# Patient Record
Sex: Male | Born: 1950 | Hispanic: No | Marital: Married | Smoking: Never smoker
Health system: Southern US, Community
[De-identification: ages and names within clinical notes are randomized; demographics above are authoritative.]

## PROBLEM LIST (undated history)

## (undated) DIAGNOSIS — I10 Essential (primary) hypertension: Secondary | ICD-10-CM

## (undated) DIAGNOSIS — R7303 Prediabetes: Secondary | ICD-10-CM

## (undated) DIAGNOSIS — T8859XA Other complications of anesthesia, initial encounter: Secondary | ICD-10-CM

## (undated) DIAGNOSIS — E039 Hypothyroidism, unspecified: Secondary | ICD-10-CM

## (undated) HISTORY — PX: SHOULDER SURGERY: SHX246

---

## 2001-05-23 ENCOUNTER — Emergency Department (HOSPITAL_COMMUNITY): Admission: EM | Admit: 2001-05-23 | Discharge: 2001-05-23 | Payer: Self-pay | Admitting: Emergency Medicine

## 2001-05-25 ENCOUNTER — Emergency Department (HOSPITAL_COMMUNITY): Admission: EM | Admit: 2001-05-25 | Discharge: 2001-05-25 | Payer: Self-pay | Admitting: Emergency Medicine

## 2005-06-06 ENCOUNTER — Ambulatory Visit: Payer: Self-pay | Admitting: Gastroenterology

## 2006-04-11 ENCOUNTER — Ambulatory Visit: Payer: Self-pay

## 2006-04-29 ENCOUNTER — Ambulatory Visit: Payer: Self-pay | Admitting: Pain Medicine

## 2006-05-07 ENCOUNTER — Ambulatory Visit: Payer: Self-pay | Admitting: Pain Medicine

## 2007-09-17 ENCOUNTER — Encounter: Admission: RE | Admit: 2007-09-17 | Discharge: 2007-09-17 | Payer: Self-pay | Admitting: Neurosurgery

## 2011-02-24 ENCOUNTER — Ambulatory Visit: Payer: Self-pay | Admitting: Unknown Physician Specialty

## 2015-01-09 DIAGNOSIS — I10 Essential (primary) hypertension: Secondary | ICD-10-CM | POA: Insufficient documentation

## 2015-08-09 ENCOUNTER — Encounter: Payer: Self-pay | Admitting: *Deleted

## 2015-08-10 ENCOUNTER — Encounter: Payer: Self-pay | Admitting: *Deleted

## 2015-08-10 ENCOUNTER — Ambulatory Visit: Payer: BLUE CROSS/BLUE SHIELD | Admitting: Anesthesiology

## 2015-08-10 ENCOUNTER — Encounter
Admission: RE | Disposition: A | Discharge status: Home / Self Care | Payer: Self-pay | Source: Ambulatory Visit | Attending: Gastroenterology

## 2015-08-10 ENCOUNTER — Ambulatory Visit
Admission: RE | Admit: 2015-08-10 | Discharge: 2015-08-10 | Disposition: A | Discharge status: Home / Self Care | Payer: BLUE CROSS/BLUE SHIELD | Source: Ambulatory Visit | Attending: Gastroenterology | Admitting: Gastroenterology

## 2015-08-10 DIAGNOSIS — Z1211 Encounter for screening for malignant neoplasm of colon: Secondary | ICD-10-CM | POA: Insufficient documentation

## 2015-08-10 DIAGNOSIS — Z79899 Other long term (current) drug therapy: Secondary | ICD-10-CM | POA: Insufficient documentation

## 2015-08-10 DIAGNOSIS — I10 Essential (primary) hypertension: Secondary | ICD-10-CM | POA: Diagnosis not present

## 2015-08-10 DIAGNOSIS — K640 First degree hemorrhoids: Secondary | ICD-10-CM | POA: Diagnosis not present

## 2015-08-10 HISTORY — PX: COLONOSCOPY WITH PROPOFOL: SHX5780

## 2015-08-10 HISTORY — DX: Essential (primary) hypertension: I10

## 2015-08-10 SURGERY — COLONOSCOPY WITH PROPOFOL
Anesthesia: General

## 2015-08-10 MED ORDER — FENTANYL CITRATE (PF) 100 MCG/2ML IJ SOLN
INTRAMUSCULAR | Status: DC | PRN
  Administered 2015-08-10: 50 ug via INTRAVENOUS

## 2015-08-10 MED ORDER — MIDAZOLAM HCL 2 MG/2ML IJ SOLN
INTRAMUSCULAR | Status: DC | PRN
  Administered 2015-08-10: 1 mg via INTRAVENOUS

## 2015-08-10 MED ORDER — SODIUM CHLORIDE 0.9 % IV SOLN
INTRAVENOUS | Status: DC
  Administered 2015-08-10: 14:00:00 via INTRAVENOUS
  Administered 2015-08-10: 1000 mL via INTRAVENOUS

## 2015-08-10 MED ORDER — PROPOFOL 500 MG/50ML IV EMUL
INTRAVENOUS | Status: DC | PRN
  Administered 2015-08-10: 125 ug/kg/min via INTRAVENOUS

## 2015-08-10 MED ORDER — SODIUM CHLORIDE 0.9 % IV SOLN: INTRAVENOUS | Status: DC

## 2015-08-10 MED ORDER — PROPOFOL 10 MG/ML IV BOLUS
INTRAVENOUS | Status: DC | PRN
  Administered 2015-08-10: 50 mg via INTRAVENOUS

## 2015-08-10 NOTE — Op Note (Signed)
[facility] Gastroenterology Patient Name: Clarence Wolf Procedure Date: 08/10/2015 1:27 PM MRN: [id number] Account #: [id number] Date of Birth: Nov 16, 1950 Admit Type: Outpatient Age: [age] Room: [facility] 3 Gender: Male Note Status: Finalized Procedure:            Colonoscopy Indications:          Screening for colorectal malignant neoplasm Providers:            Christena DeemMartin U. Skulskie, MD Referring MD:         Barbette ReichmannVishwanath Hande, MD (Referring MD) Medicines:            Monitored Anesthesia Care Complications:        No immediate complications. Procedure:            Pre-Anesthesia Assessment:                       - ASA Grade Assessment: II - A patient with mild                        systemic disease.                       After obtaining informed consent, the colonoscope was                        passed under direct vision. Throughout the procedure,                        the patient's blood pressure, pulse, and oxygen                        saturations were monitored continuously. The                        Colonoscope was introduced through the anus and                        advanced to the the cecum, identified by appendiceal                        orifice and ileocecal valve. The colonoscopy was                        performed without difficulty. The patient tolerated the                        procedure well. The quality of the bowel preparation                        was good. Findings:      The colon (entire examined portion) appeared normal.      The digital rectal exam was normal.      Non-bleeding internal hemorrhoids were found during anoscopy. The       hemorrhoids were small and Grade I (internal hemorrhoids that do not       prolapse). Impression:           - The entire examined colon is normal.                       - Non-bleeding internal hemorrhoids.                       -  No specimens collected. Recommendation:       - Repeat colonoscopy in  10 years for screening purposes. Procedure Code(s):    --- Professional ---                       [phone], Colonoscopy, flexible; diagnostic, including                        collection of specimen(s) by brushing or washing, when                        performed (separate procedure) Diagnosis Code(s):    --- Professional ---                       Z12.11, Encounter for screening for malignant neoplasm                        of colon                       K64.0, First degree hemorrhoids CPT copyright 2016 American Medical Association. All rights reserved. The codes documented in this report are preliminary and upon coder review may  be revised to meet current compliance requirements. Christena Deem, MD 08/10/2015 2:00:27 PM This report has been signed electronically. Number of Addenda: 0 Note Initiated On: 08/10/2015 1:27 PM Scope Withdrawal Time: 0 hours 6 minutes 27 seconds  Total Procedure Duration: 0 hours 15 minutes 12 seconds       [facility]

## 2015-08-10 NOTE — H&P (Signed)
Outpatient short stay form Pre-procedure 08/10/2015 1:32 PM Clarence DeemMartin U Skulskie MD  Primary Physician: Dr Barbette ReichmannVishwanath Hande  Reason for visit:  Screening colonoscopy  History of present illness:  Patient is a 65 year old male presenting today for colonoscopy. Discussion with patient obtained through an interpreter. Patient has had a colonoscopy in the past she is uncertain of the findings. He tolerated his prep well. He denies use of any aspirin products with the exception of 81 mg aspirin which she has held for the past 2 days. He takes no blood thinning agents.    Current facility-administered medications:  .  0.9 %  sodium chloride infusion, , Intravenous, Continuous, Clarence DeemMartin U Skulskie, MD, Last Rate: 20 mL/hr at 08/10/15 1256, 1,000 mL at 08/10/15 1256 .  0.9 %  sodium chloride infusion, , Intravenous, Continuous, Clarence DeemMartin U Skulskie, MD  Prescriptions prior to admission  Medication Sig Dispense Refill Last Dose  . fluticasone (FLONASE) 50 MCG/ACT nasal spray Place 2 sprays into both nostrils daily.   Past Week at Unknown time  . losartan-hydrochlorothiazide (HYZAAR) 50-12.5 MG tablet Take 1 tablet by mouth daily.   Past Week at Unknown time  . Multiple Vitamin (MULTIVITAMIN) tablet Take 1 tablet by mouth daily.   Past Week at Unknown time     No Known Allergies   Past Medical History  Diagnosis Date  . Hypertension     Review of systems:      Physical Exam    Heart and lungs: Regular rate and rhythm without rub or gallop, lungs are bilaterally clear.    HEENT: Normocephalic atraumatic eyes are anicteric    Other:     Pertinant exam for procedure: Soft nontender nondistended bowel sounds positive normoactive. There is a generalized discomfort that the patient relates as being from the prep. Is no rebound.    Planned proceedures: Colonoscopy and indicated procedures. I have discussed the risks benefits and complications of procedures to include not limited to bleeding,  infection, perforation and the risk of sedation and the patient wishes to proceed.    Clarence DeemMartin U Skulskie, MD Gastroenterology 08/10/2015  1:32 PM

## 2015-08-10 NOTE — Anesthesia Procedure Notes (Signed)
Date/Time: 08/10/2015 1:35 PM Performed by: Junious SilkNOLES, Seamus Warehime Pre-anesthesia Checklist: Patient identified, Emergency Drugs available, Suction available, Patient being monitored and Timeout performed Oxygen Delivery Method: Nasal cannula

## 2015-08-10 NOTE — Transfer of Care (Signed)
Immediate Anesthesia Transfer of Care Note  Patient: Clarence MillerManuel D. Wolf  Procedure(s) Performed: Procedure(s): COLONOSCOPY WITH PROPOFOL (N/A)  Patient Location: PACU  Anesthesia Type:General  Level of Consciousness: sedated  Airway & Oxygen Therapy: Patient Spontanous Breathing and Patient connected to nasal cannula oxygen  Post-op Assessment: Report given to RN and Post -op Vital signs reviewed and stable  Post vital signs: Reviewed and stable  Last Vitals:  Filed Vitals:   08/10/15 1241  BP: 144/74  Pulse: 50  Temp: 36 C  Resp: 18    Last Pain: There were no vitals filed for this visit.       Complications: No apparent anesthesia complications

## 2015-08-10 NOTE — Anesthesia Preprocedure Evaluation (Addendum)
Anesthesia Evaluation  Patient identified by MRN, date of birth, ID band Patient awake    Reviewed: Allergy & Precautions, NPO status , Patient's Chart, lab work & pertinent test results, reviewed documented beta blocker date and time   Airway Mallampati: II  TM Distance: >3 FB     Dental  (+) Chipped, Partial Lower, Poor Dentition   Pulmonary           Cardiovascular hypertension, Pt. on medications      Neuro/Psych    GI/Hepatic   Endo/Other    Renal/GU      Musculoskeletal   Abdominal   Peds  Hematology   Anesthesia Other Findings Has had severe back pain for years.  Reproductive/Obstetrics                            Anesthesia Physical Anesthesia Plan  ASA: II  Anesthesia Plan: General   Post-op Pain Management:    Induction: Intravenous  Airway Management Planned: Nasal Cannula  Additional Equipment:   Intra-op Plan:   Post-operative Plan:   Informed Consent: I have reviewed the patients History and Physical, chart, labs and discussed the procedure including the risks, benefits and alternatives for the proposed anesthesia with the patient or authorized representative who has indicated his/her understanding and acceptance.     Plan Discussed with: CRNA  Anesthesia Plan Comments:         Anesthesia Quick Evaluation

## 2015-08-10 NOTE — Anesthesia Postprocedure Evaluation (Signed)
Anesthesia Post Note  Patient: Clarence MillerManuel D. Wolf  Procedure(s) Performed: Procedure(s) (LRB): COLONOSCOPY WITH PROPOFOL (N/A)  Patient location during evaluation: Endoscopy Anesthesia Type: General Level of consciousness: awake and alert Pain management: pain level controlled Vital Signs Assessment: post-procedure vital signs reviewed and stable Respiratory status: spontaneous breathing, nonlabored ventilation, respiratory function stable and patient connected to nasal cannula oxygen Cardiovascular status: blood pressure returned to baseline and stable Postop Assessment: no signs of nausea or vomiting Anesthetic complications: no    Last Vitals:  Filed Vitals:   08/10/15 1420 08/10/15 1430  BP: 103/71 121/77  Pulse: 48 47  Temp:    Resp: 11 18    Last Pain: There were no vitals filed for this visit.               Ameliana Brashear S

## 2015-08-13 ENCOUNTER — Encounter: Payer: Self-pay | Admitting: Gastroenterology

## 2018-03-18 DIAGNOSIS — H0015 Chalazion left lower eyelid: Secondary | ICD-10-CM | POA: Diagnosis not present

## 2018-03-18 DIAGNOSIS — H0012 Chalazion right lower eyelid: Secondary | ICD-10-CM | POA: Diagnosis not present

## 2018-04-27 DIAGNOSIS — H0012 Chalazion right lower eyelid: Secondary | ICD-10-CM | POA: Diagnosis not present

## 2018-04-27 DIAGNOSIS — H0015 Chalazion left lower eyelid: Secondary | ICD-10-CM | POA: Diagnosis not present

## 2018-05-25 DIAGNOSIS — R6889 Other general symptoms and signs: Secondary | ICD-10-CM | POA: Diagnosis not present

## 2018-05-25 DIAGNOSIS — M792 Neuralgia and neuritis, unspecified: Secondary | ICD-10-CM | POA: Diagnosis not present

## 2018-05-25 DIAGNOSIS — N529 Male erectile dysfunction, unspecified: Secondary | ICD-10-CM | POA: Diagnosis not present

## 2018-05-25 DIAGNOSIS — Z0000 Encounter for general adult medical examination without abnormal findings: Secondary | ICD-10-CM | POA: Diagnosis not present

## 2018-05-25 DIAGNOSIS — I10 Essential (primary) hypertension: Secondary | ICD-10-CM | POA: Diagnosis not present

## 2018-05-25 DIAGNOSIS — H0015 Chalazion left lower eyelid: Secondary | ICD-10-CM | POA: Diagnosis not present

## 2018-05-25 DIAGNOSIS — D649 Anemia, unspecified: Secondary | ICD-10-CM | POA: Diagnosis not present

## 2018-05-25 DIAGNOSIS — H2513 Age-related nuclear cataract, bilateral: Secondary | ICD-10-CM | POA: Diagnosis not present

## 2018-05-25 DIAGNOSIS — Z125 Encounter for screening for malignant neoplasm of prostate: Secondary | ICD-10-CM | POA: Diagnosis not present

## 2018-05-25 DIAGNOSIS — H04123 Dry eye syndrome of bilateral lacrimal glands: Secondary | ICD-10-CM | POA: Diagnosis not present

## 2018-06-01 DIAGNOSIS — E039 Hypothyroidism, unspecified: Secondary | ICD-10-CM | POA: Insufficient documentation

## 2018-06-01 DIAGNOSIS — M792 Neuralgia and neuritis, unspecified: Secondary | ICD-10-CM | POA: Insufficient documentation

## 2018-06-01 DIAGNOSIS — N529 Male erectile dysfunction, unspecified: Secondary | ICD-10-CM | POA: Diagnosis not present

## 2018-06-01 DIAGNOSIS — H1013 Acute atopic conjunctivitis, bilateral: Secondary | ICD-10-CM | POA: Diagnosis not present

## 2018-06-01 DIAGNOSIS — I10 Essential (primary) hypertension: Secondary | ICD-10-CM | POA: Diagnosis not present

## 2018-06-01 DIAGNOSIS — Z9181 History of falling: Secondary | ICD-10-CM | POA: Insufficient documentation

## 2018-06-01 DIAGNOSIS — Z0000 Encounter for general adult medical examination without abnormal findings: Secondary | ICD-10-CM | POA: Diagnosis not present

## 2018-06-01 DIAGNOSIS — M545 Low back pain, unspecified: Secondary | ICD-10-CM | POA: Insufficient documentation

## 2018-06-01 DIAGNOSIS — G8929 Other chronic pain: Secondary | ICD-10-CM | POA: Insufficient documentation

## 2018-06-01 DIAGNOSIS — Z6834 Body mass index (BMI) 34.0-34.9, adult: Secondary | ICD-10-CM | POA: Diagnosis not present

## 2018-08-10 DIAGNOSIS — Z20828 Contact with and (suspected) exposure to other viral communicable diseases: Secondary | ICD-10-CM | POA: Diagnosis not present

## 2018-11-30 DIAGNOSIS — E039 Hypothyroidism, unspecified: Secondary | ICD-10-CM | POA: Diagnosis not present

## 2018-11-30 DIAGNOSIS — I10 Essential (primary) hypertension: Secondary | ICD-10-CM | POA: Diagnosis not present

## 2018-11-30 DIAGNOSIS — Z9181 History of falling: Secondary | ICD-10-CM | POA: Diagnosis not present

## 2018-11-30 DIAGNOSIS — M792 Neuralgia and neuritis, unspecified: Secondary | ICD-10-CM | POA: Diagnosis not present

## 2018-11-30 DIAGNOSIS — G8929 Other chronic pain: Secondary | ICD-10-CM | POA: Diagnosis not present

## 2018-11-30 DIAGNOSIS — M545 Low back pain: Secondary | ICD-10-CM | POA: Diagnosis not present

## 2018-11-30 DIAGNOSIS — Z6834 Body mass index (BMI) 34.0-34.9, adult: Secondary | ICD-10-CM | POA: Diagnosis not present

## 2018-12-02 DIAGNOSIS — J029 Acute pharyngitis, unspecified: Secondary | ICD-10-CM | POA: Diagnosis not present

## 2018-12-02 DIAGNOSIS — R52 Pain, unspecified: Secondary | ICD-10-CM | POA: Diagnosis not present

## 2018-12-02 DIAGNOSIS — Z03818 Encounter for observation for suspected exposure to other biological agents ruled out: Secondary | ICD-10-CM | POA: Diagnosis not present

## 2018-12-07 DIAGNOSIS — I10 Essential (primary) hypertension: Secondary | ICD-10-CM | POA: Diagnosis not present

## 2018-12-07 DIAGNOSIS — M792 Neuralgia and neuritis, unspecified: Secondary | ICD-10-CM | POA: Diagnosis not present

## 2018-12-07 DIAGNOSIS — N529 Male erectile dysfunction, unspecified: Secondary | ICD-10-CM | POA: Diagnosis not present

## 2018-12-07 DIAGNOSIS — E039 Hypothyroidism, unspecified: Secondary | ICD-10-CM | POA: Diagnosis not present

## 2018-12-07 DIAGNOSIS — Z0000 Encounter for general adult medical examination without abnormal findings: Secondary | ICD-10-CM | POA: Diagnosis not present

## 2018-12-07 DIAGNOSIS — E785 Hyperlipidemia, unspecified: Secondary | ICD-10-CM | POA: Diagnosis not present

## 2018-12-15 DIAGNOSIS — H2513 Age-related nuclear cataract, bilateral: Secondary | ICD-10-CM | POA: Diagnosis not present

## 2018-12-15 DIAGNOSIS — H0015 Chalazion left lower eyelid: Secondary | ICD-10-CM | POA: Diagnosis not present

## 2018-12-15 DIAGNOSIS — H04123 Dry eye syndrome of bilateral lacrimal glands: Secondary | ICD-10-CM | POA: Diagnosis not present

## 2020-12-20 ENCOUNTER — Emergency Department
Admission: EM | Admit: 2020-12-20 | Discharge: 2020-12-20 | Disposition: A | Discharge status: Home / Self Care | Payer: Medicare Other | Attending: Emergency Medicine | Admitting: Emergency Medicine

## 2020-12-20 ENCOUNTER — Emergency Department: Payer: Medicare Other

## 2020-12-20 ENCOUNTER — Other Ambulatory Visit: Payer: Self-pay

## 2020-12-20 DIAGNOSIS — G510 Bell's palsy: Secondary | ICD-10-CM | POA: Insufficient documentation

## 2020-12-20 DIAGNOSIS — R29810 Facial weakness: Secondary | ICD-10-CM | POA: Diagnosis present

## 2020-12-20 DIAGNOSIS — Z79899 Other long term (current) drug therapy: Secondary | ICD-10-CM | POA: Insufficient documentation

## 2020-12-20 DIAGNOSIS — I10 Essential (primary) hypertension: Secondary | ICD-10-CM | POA: Insufficient documentation

## 2020-12-20 MED ORDER — VALACYCLOVIR HCL 1 G PO TABS: 1000.0000 mg | ORAL_TABLET | Freq: Three times a day (TID) | ORAL | 0 refills | Status: DC

## 2020-12-20 MED ORDER — PREDNISONE 50 MG PO TABS: 50.0000 mg | ORAL_TABLET | Freq: Every day | ORAL | 0 refills | Status: DC

## 2020-12-20 MED ORDER — PREDNISONE 50 MG PO TABS: 50.0000 mg | ORAL_TABLET | Freq: Every day | ORAL | 0 refills | Status: AC

## 2020-12-20 MED ORDER — VALACYCLOVIR HCL 1 G PO TABS: 1000.0000 mg | ORAL_TABLET | Freq: Three times a day (TID) | ORAL | 0 refills | Status: AC

## 2020-12-20 NOTE — ED Provider Notes (Addendum)
Emergency Medicine Provider Triage Evaluation Note  Clarence Wolf , a 70 y.o. male  was evaluated in triage.  Pt complains of left-sided face drooping, drainage from left eye.  Review of Systems  Positive: Facial droop Negative: No weakness, chest pain, shortness of breath or headache  Physical Exam  BP (!) 141/81 (BP Location: Left Arm)   Pulse (!) 47   Temp 97.8 F (36.6 C)   Resp 16   SpO2 97%  Gen:   Awake, no distress   Resp:  Normal effort  MSK:   Moves extremities without difficulty  Other:  Left-sided facial droop, cannot smile on the left  Medical Decision Making  Medically screening exam initiated at 12:06 PM.  Appropriate orders placed.  Clarence Wolf was informed that the remainder of the evaluation will be completed by another provider, this initial triage assessment does not replace that evaluation, and the importance of remaining in the ED until their evaluation is complete.    Faythe Ghee, PA-C 12/20/20 1207    Faythe Ghee, PA-C 12/20/20 1208    Sharman Cheek, MD 12/20/20 [phone]

## 2020-12-20 NOTE — ED Triage Notes (Addendum)
Pt comes with c/o facial swelling since this am. Pt states little pain. Pt denies any dental issues.  Pt states watering from left eye. Pt denies any blurry vision. Pt states some pain to left ear. Pt denies any weakness, CP or SOb.

## 2020-12-20 NOTE — ED Notes (Signed)
Patient stable and discharged with all personal belongings and AVS. AVS and discharge instructions reviewed with patient and opportunity for questions provided.   

## 2020-12-20 NOTE — ED Provider Notes (Signed)
[facility] EMERGENCY DEPARTMENT Provider Note   CSN: [id number] Arrival date & time: 12/20/20  1130     History Chief Complaint  Patient presents with   Facial Swelling    Clarence Wolf is a 70 y.o. male presents to the emergency department evaluation of left-sided facial drooping.  Symptoms began today.  Patient denies any trauma or injury or headaches.  No fevers chills or body aches, rashes.  He states this morning he noticed drooping to the left side of his face with watery left eye.  He denies any weakness throughout the body.  No slurred speech or confusion.  He is tolerating p.o. well.  HPI     Past Medical History:  Diagnosis Date   Hypertension     There are no problems to display for this patient.   Past Surgical History:  Procedure Laterality Date   COLONOSCOPY WITH PROPOFOL N/A 08/10/2015   Procedure: COLONOSCOPY WITH PROPOFOL;  Surgeon: Christena Deem, MD;  Location: [facility] ENDOSCOPY;  Service: Endoscopy;  Laterality: N/A;   SHOULDER SURGERY         No family history on file.  Social History   Tobacco Use   Smoking status: Never   Smokeless tobacco: Never  Substance Use Topics   Alcohol use: Yes   Drug use: No    Home Medications Prior to Admission medications   Medication Sig Start Date End Date Taking? Authorizing Provider  predniSONE (DELTASONE) 50 MG tablet Take 1 tablet (50 mg total) by mouth daily for 5 days. 12/20/20 12/25/20 Yes Evon Slack, PA-C  valACYclovir (VALTREX) 1000 MG tablet Take 1 tablet (1,000 mg total) by mouth 3 (three) times daily for 7 days. 12/20/20 12/27/20 Yes Evon Slack, PA-C  fluticasone (FLONASE) 50 MCG/ACT nasal spray Place 2 sprays into both nostrils daily.    [provider]  losartan-hydrochlorothiazide (HYZAAR) 50-12.5 MG tablet Take 1 tablet by mouth daily.    [provider]  Multiple Vitamin (MULTIVITAMIN) tablet Take 1 tablet by mouth daily.    [provider]    Allergies    Patient has no known allergies.  Review of Systems   Review of Systems  Constitutional:  Negative for chills and fever.  HENT:  Negative for sore throat, trouble swallowing and voice change.   Eyes:  Positive for discharge. Negative for photophobia and pain.  Respiratory:  Negative for shortness of breath.   Cardiovascular:  Negative for chest pain.  Gastrointestinal:  Negative for abdominal pain.  Genitourinary:  Negative for difficulty urinating, dysuria and urgency.  Musculoskeletal:  Negative for back pain and myalgias.  Skin:  Negative for rash and wound.  Neurological:  Negative for dizziness and headaches.   Physical Exam Updated Vital Signs BP 138/78 (BP Location: Left Arm)   Pulse (!) 58   Temp 97.8 F (36.6 C)   Resp 16   Ht 5\' 1"  (1.549 m)   Wt 77.1 kg   SpO2 98%   BMI 32.12 kg/m   Physical Exam Constitutional:      Appearance: He is well-developed.  HENT:     Head: Normocephalic and atraumatic.     Comments: Left-sided facial droop with inability to raise the left eyebrow.  Left lower eyelid drooping with clear watery drainage from the left eye.  Normal sensation.    Right Ear: Ear canal normal.     Left Ear: Ear canal normal.     Nose: No congestion or rhinorrhea.  Mouth/Throat:     Mouth: Mucous membranes are moist.     Pharynx: No oropharyngeal exudate or posterior oropharyngeal erythema.  Eyes:     Extraocular Movements: Extraocular movements intact.     Conjunctiva/sclera: Conjunctivae normal.     Pupils: Pupils are equal, round, and reactive to light.  Cardiovascular:     Rate and Rhythm: Normal rate.  Pulmonary:     Effort: Pulmonary effort is normal. No respiratory distress.  Musculoskeletal:        General: Normal range of motion.     Cervical back: Normal range of motion.     Comments: No weakness in the upper or lower extremities.  Skin:    General: Skin is warm.     Findings: No rash.  Neurological:      General: No focal deficit present.     Mental Status: He is alert and oriented to person, place, and time. Mental status is at baseline.  Psychiatric:        Mood and Affect: Mood normal.        Behavior: Behavior normal.        Thought Content: Thought content normal.    ED Results / Procedures / Treatments   Labs (all labs ordered are listed, but only abnormal results are displayed) Labs Reviewed - No data to display  EKG None  Radiology CT HEAD WO CONTRAST ( )  Result Date: 12/20/2020 CLINICAL DATA:  Neuro deficit, acute, stroke suspected EXAM: CT HEAD WITHOUT CONTRAST TECHNIQUE: Contiguous axial images were obtained from the base of the skull through the vertex without intravenous contrast. COMPARISON:  None. FINDINGS: Brain: No evidence of acute infarction, hemorrhage, hydrocephalus, extra-axial collection or mass lesion/mass effect. Mild patchy white matter hypodensities, nonspecific but compatible with chronic microvascular ischemic disease. Vascular: No hyperdense vessel identified. Skull: No acute fracture. Sinuses/Orbits: Mild ethmoid air cell mucosal thickening. Remote appearing right medial orbital wall fracture with herniation of medial rectus through the defect. Other: No mastoid effusions. IMPRESSION: 1. No evidence of acute intracranial abnormality. 2. Mild chronic microvascular ischemic disease. 3. Remote appearing right medial orbital wall fracture with herniation of medial rectus through the defect. Electronically Signed   By: Feliberto Harts M.D.   On: 12/20/2020 13:38    Procedures Procedures   Medications Ordered in ED Medications - No data to display  ED Course  I have reviewed the triage vital signs and the nursing notes.  Pertinent labs & imaging results that were available during my care of the patient were reviewed by me and considered in my medical decision making (see chart for details).    MDM Rules/Calculators/A&P                          70 year old male with acute onset left-sided facial drooping with no other symptoms or neurological deficits.  History and exam consistent with Bell's palsy.  He is started on antiviral medications and steroids.  He underwent CT of the head which was negative.  Discussed Bell's palsy with patient.  He understands signs and symptoms to return to the ER for. Final Clinical Impression(s) / ED Diagnoses Final diagnoses:  Bell's palsy    Rx / DC Orders ED Discharge Orders          Ordered    valACYclovir (VALTREX) 1000 MG tablet  3 times daily        12/20/20 1632    predniSONE (DELTASONE) 50 MG tablet  Daily  12/20/20 1632             Evon Slack, PA-C 12/20/20 1645    Sharman Cheek, MD 12/20/20 [phone]

## 2020-12-20 NOTE — Discharge Instructions (Signed)
Please take medications as prescribed.  Use artificial tears eyedrops in your left eye to prevent dryness.  Return to the ER for any worsening symptoms or urgent changes in your health.

## 2020-12-20 NOTE — ED Notes (Signed)
See triage note  presents with left eye draining and not able to close his eye completely  feels like his mouth is going to the left

## 2021-01-22 ENCOUNTER — Other Ambulatory Visit (INDEPENDENT_AMBULATORY_CARE_PROVIDER_SITE_OTHER): Payer: Self-pay | Admitting: Nurse Practitioner

## 2021-01-22 DIAGNOSIS — I83813 Varicose veins of bilateral lower extremities with pain: Secondary | ICD-10-CM

## 2021-01-23 ENCOUNTER — Ambulatory Visit (INDEPENDENT_AMBULATORY_CARE_PROVIDER_SITE_OTHER): Payer: Medicare Other | Admitting: Nurse Practitioner

## 2021-01-23 ENCOUNTER — Other Ambulatory Visit: Payer: Self-pay

## 2021-01-23 ENCOUNTER — Ambulatory Visit (INDEPENDENT_AMBULATORY_CARE_PROVIDER_SITE_OTHER): Payer: Medicare Other

## 2021-01-23 ENCOUNTER — Encounter (INDEPENDENT_AMBULATORY_CARE_PROVIDER_SITE_OTHER): Payer: Self-pay | Admitting: Nurse Practitioner

## 2021-01-23 VITALS — BP 163/88 | HR 48 | Ht 61.0 in | Wt 175.0 lb

## 2021-01-23 DIAGNOSIS — I83813 Varicose veins of bilateral lower extremities with pain: Secondary | ICD-10-CM

## 2021-01-23 DIAGNOSIS — I10 Essential (primary) hypertension: Secondary | ICD-10-CM

## 2021-01-23 DIAGNOSIS — R200 Anesthesia of skin: Secondary | ICD-10-CM

## 2021-01-23 DIAGNOSIS — I83893 Varicose veins of bilateral lower extremities with other complications: Secondary | ICD-10-CM | POA: Diagnosis not present

## 2021-01-27 NOTE — Progress Notes (Signed)
Subjective:    Patient ID: Clarence Wolf, male    DOB: [birth year], 70 y.o.   MRN: [id number] Chief Complaint  Patient presents with   New Patient (Initial Visit)    NP U/S and consult BIL VVV W/ pain referred by [facility] is a 70 year old male that is referred by his primary care provider Dr. Marcello Wolf in regards to varicose veins.  The patient is seen with use of an interpreter.  The patient notes that he does not have any pain or discomfort in his lower extremities that is consistent with varicose veins.  He denies tenderness burning or stinging in his lower extremities.  The greatest concern for the patient is numbness in his bilateral lower extremities.  He notes that the numbness is from his waist down.  He denies any sweats welling of his lower extremities or issues consistent with varicose veins.  Today noninvasive studies show reflux in his right great saphenous vein at the proximal calf.  There is no evidence of DVT or superficial thrombophlebitis bilaterally.  Limited bilateral lower extremity arterial duplex shows triphasic waveforms throughout his bilateral lower extremities.   Review of Systems  Cardiovascular:  Negative for leg swelling.  Musculoskeletal:  Positive for back pain.  Neurological:  Positive for numbness.  All other systems reviewed and are negative.     Objective:   Physical Exam Vitals reviewed.  HENT:     Head: Normocephalic.  Cardiovascular:     Rate and Rhythm: Normal rate.     Pulses: Normal pulses.  Pulmonary:     Effort: Pulmonary effort is normal.  Musculoskeletal:        General: No tenderness.  Neurological:     Mental Status: He is alert and oriented to person, place, and time.  Psychiatric:        Mood and Affect: Mood normal.        Behavior: Behavior normal.        Thought Content: Thought content normal.        Judgment: Judgment normal.    BP (!) 163/88   Pulse (!) 48   Ht 5\' 1"  (1.549 m)   Wt 175 lb (79.4 kg)    BMI 33.07 kg/m   Past Medical History:  Diagnosis Date   Hypertension     Social History   Socioeconomic History   Marital status: Married    Spouse name: Not on file   Number of children: Not on file   Years of education: Not on file   Highest education level: Not on file  Occupational History   Not on file  Tobacco Use   Smoking status: Never   Smokeless tobacco: Never  Substance and Sexual Activity   Alcohol use: Yes   Drug use: No   Sexual activity: Not on file  Other Topics Concern   Not on file  Social History Narrative   Not on file   Social Determinants of Health   Financial Resource Strain: Not on file  Food Insecurity: Not on file  Transportation Needs: Not on file  Physical Activity: Not on file  Stress: Not on file  Social Connections: Not on file  Intimate Partner Violence: Not on file    Past Surgical History:  Procedure Laterality Date   COLONOSCOPY WITH PROPOFOL N/A 08/10/2015   Procedure: COLONOSCOPY WITH PROPOFOL;  Surgeon: 10/10/2015, MD;  Location: [facility] ENDOSCOPY;  Service: Endoscopy;  Laterality: N/A;   SHOULDER SURGERY  History reviewed. No pertinent family history.  No Known Allergies  No flowsheet data found.    CMP  No results found for: NA, K, CL, CO2, GLUCOSE, BUN, CREATININE, CALCIUM, PROT, ALBUMIN, AST, ALT, ALKPHOS, BILITOT, GFRNONAA, GFRAA   No results found.     Assessment & Plan:   1. Numbness of legs The  triphasic waveforms in his bilateral lower extremity indicated adequate perfusion.  The patient does have a noted history of spinal stenosis as well as lumbar degenerative disc disease.  He is also having history of a fall from a high elevation.  Based on known issues within his lumbar spine we will refer the patient to neurosurgery for further evaluation of numbness of lower extremities. - Ambulatory referral to Neurosurgery  2. Varicose veins of bilateral lower extremities with other  complications Currently the patient has no symptoms that indicate that he has any pain within his varicose veins.  His primary concern for his legs is the numbness that continues.  Based on this we discussed conservative therapy with the patient including use of compression, elevation and activity.  Patient will follow up with [place] on an as-needed basis if the varicosities begin to cause pain and discomfort.  3. Benign essential hypertension Continue antihypertensive medications as already ordered, these medications have been reviewed and there are no changes at this time.    Current Outpatient Medications on File Prior to Visit  Medication Sig Dispense Refill   acetaminophen (TYLENOL) 650 MG CR tablet Take by mouth.     fluticasone (FLONASE) 50 MCG/ACT nasal spray Place 2 sprays into both nostrils daily.     hydrochlorothiazide (MICROZIDE) 12.5 MG capsule Take 12.5 mg by mouth daily.     levothyroxine (SYNTHROID) 75 MCG tablet Take 75 mcg by mouth daily.     losartan (COZAAR) 50 MG tablet Take 50 mg by mouth daily.     meloxicam (MOBIC) 15 MG tablet Take by mouth.     Multiple Vitamin (MULTIVITAMIN) tablet Take 1 tablet by mouth daily.     rosuvastatin (CRESTOR) 5 MG tablet Take by mouth.     sildenafil (VIAGRA) 100 MG tablet Take by mouth.     tamsulosin (FLOMAX) 0.4 MG CAPS capsule TAKE 1 CAPSULE(0.4 MG) BY MOUTH EVERY DAY     No current facility-administered medications on file prior to visit.    There are no Patient Instructions on file for this visit. No follow-ups on file.   Clarence Spinner, NP

## 2021-03-27 ENCOUNTER — Other Ambulatory Visit: Payer: Self-pay | Admitting: Neurosurgery

## 2021-03-27 DIAGNOSIS — M5136 Other intervertebral disc degeneration, lumbar region: Secondary | ICD-10-CM

## 2021-03-27 DIAGNOSIS — M4807 Spinal stenosis, lumbosacral region: Secondary | ICD-10-CM

## 2021-04-03 ENCOUNTER — Ambulatory Visit
Admission: RE | Admit: 2021-04-03 | Discharge: 2021-04-03 | Disposition: A | Discharge status: Home / Self Care | Payer: Medicare Other | Source: Ambulatory Visit | Attending: Neurosurgery | Admitting: Neurosurgery

## 2021-04-03 DIAGNOSIS — M5136 Other intervertebral disc degeneration, lumbar region: Secondary | ICD-10-CM | POA: Insufficient documentation

## 2021-04-03 DIAGNOSIS — M4807 Spinal stenosis, lumbosacral region: Secondary | ICD-10-CM | POA: Insufficient documentation

## 2021-05-15 ENCOUNTER — Other Ambulatory Visit: Payer: Self-pay | Admitting: Neurosurgery

## 2021-05-15 DIAGNOSIS — Z01818 Encounter for other preprocedural examination: Secondary | ICD-10-CM

## 2021-05-22 ENCOUNTER — Other Ambulatory Visit: Payer: Self-pay

## 2021-05-22 ENCOUNTER — Encounter
Admission: RE | Admit: 2021-05-22 | Discharge: 2021-05-22 | Disposition: A | Discharge status: Home / Self Care | Payer: Medicare Other | Source: Ambulatory Visit | Attending: Neurosurgery | Admitting: Neurosurgery

## 2021-05-22 DIAGNOSIS — I10 Essential (primary) hypertension: Secondary | ICD-10-CM | POA: Diagnosis not present

## 2021-05-22 DIAGNOSIS — Z01812 Encounter for preprocedural laboratory examination: Secondary | ICD-10-CM | POA: Insufficient documentation

## 2021-05-22 DIAGNOSIS — Z01810 Encounter for preprocedural cardiovascular examination: Secondary | ICD-10-CM | POA: Diagnosis not present

## 2021-05-22 HISTORY — DX: Prediabetes: R73.03

## 2021-05-22 HISTORY — DX: Other complications of anesthesia, initial encounter: T88.59XA

## 2021-05-22 HISTORY — DX: Hypothyroidism, unspecified: E03.9

## 2021-05-22 LAB — APTT: aPTT: 34 seconds (ref 24–36)

## 2021-05-22 LAB — BASIC METABOLIC PANEL
Anion gap: 7 (ref 5–15)
BUN: 16 mg/dL (ref 8–23)
CO2: 27 mmol/L (ref 22–32)
Calcium: 9.9 mg/dL (ref 8.9–10.3)
Chloride: 97 mmol/L — ABNORMAL LOW (ref 98–111)
Creatinine, Ser: 0.63 mg/dL (ref 0.61–1.24)
GFR, Estimated: >60 mL/min (ref >60)
Glucose, Bld: 90 mg/dL (ref 70–99)
Potassium: 3.9 mmol/L (ref 3.5–5.1)
Sodium: 131 mmol/L — ABNORMAL LOW (ref 135–145)

## 2021-05-22 LAB — TYPE AND SCREEN
ABO/RH(D): O POS
Antibody Screen: NEGATIVE

## 2021-05-22 LAB — CBC
HCT: 42.7 % (ref 39.0–52.0)
Hemoglobin: 15 g/dL (ref 13.0–17.0)
MCH: 30.5 pg (ref 26.0–34.0)
MCHC: 35.1 g/dL (ref 30.0–36.0)
MCV: 87 fL (ref 80.0–100.0)
Platelets: 338 10*3/uL (ref 150–400)
RBC: 4.91 MIL/uL (ref 4.22–5.81)
RDW: 12 % (ref 11.5–15.5)
WBC: 7.2 10*3/uL (ref 4.0–10.5)
nRBC: 0 % (ref 0.0–0.2)

## 2021-05-22 LAB — URINALYSIS, ROUTINE W REFLEX MICROSCOPIC
Bilirubin Urine: NEGATIVE
Glucose, UA: NEGATIVE mg/dL
Hgb urine dipstick: NEGATIVE
Ketones, ur: NEGATIVE mg/dL
Leukocytes,Ua: NEGATIVE
Nitrite: NEGATIVE
Protein, ur: NEGATIVE mg/dL
Specific Gravity, Urine: 1.012 (ref 1.005–1.030)
pH: 5 (ref 5.0–8.0)

## 2021-05-22 LAB — PROTIME-INR
INR: 1 (ref 0.8–1.2)
Prothrombin Time: 13.1 seconds (ref 11.4–15.2)

## 2021-05-22 LAB — SURGICAL PCR SCREEN
MRSA, PCR: NEGATIVE
Staphylococcus aureus: NEGATIVE

## 2021-05-22 NOTE — Patient Instructions (Addendum)
Your procedure is scheduled on: Monday June 03, 2021. ?Su procedimiento est? programado para: Lunes 27 de Marzo del 2023. ?Report to Day [facility] inside [facility] 2nd floor, stop by admissions desk before getting on elevator. ?Pres?ntese a: Diplomatic Services operational officer del [facility] 2do piso, registrese primero en admisiones antes de subir al [employer].  ?To find out your arrival time please call [phone] between 1PM - 3PM on Friday May 31, 2021. ?Para saber su hora de llegada por favor llame al [phone] entre la 1PM - 3PM el d?a: Viernes 24 de Marzo del 2023. ? ?Remember: Instructions that are not followed completely may result in serious medical risk, up to and including death,  ?or upon the discretion of your surgeon and anesthesiologist your surgery may need to be rescheduled.  ?Recuerde: Las instrucciones que no se siguen completamente pueden resultar en un riesgo de salud grave, incluyendo hasta  ?la [place] o a discreci?n de su cirujano y anestesi?logo, su cirug?a se puede posponer. ? ? __X_ 1.Do not eat food after midnight the night before your procedure. No  ?  gum chewing or hard candies. You may drink clear liquids up to 2 hours   ?  before you are scheduled to arrive for your surgery- DO not drink clear   ?  Liquids within 2 hours of the start of your surgery.  ? ?  Clear Liquids include:  ?  water, apple juice without pulp, clear carbohydrate drink such as  ?  Clearfast of [place], Black Coffee or Tea (Do not add anything to coffee or tea).   ? ?  No coma nada despu?s de la medianoche de la noche anterior a su  ?  procedimiento. No coma chicles ni caramelos duros. Puede tomar  ?  l?quidos claros hasta 2 horas antes de su hora programada de llegada al   ?  hospital para su procedimiento. No tome l?quidos claros durante el   ?  transcurso de las 2 horas de su llegada programada al hospital para su   ?  procedimiento, ya que esto puede llevar a que su procedimiento se    ?retrase o tenga  que volver a programarse. ? ?Los l?quidos claros incluyen: ?         - [place] o jugo de [place] sin pulpa ?         - Bebidas claras con carbohidratos como ClearFast o Gatorade ?         - Caf? negro o t? claro (sin leche, sin cremas, no agregue nada al caf? ni al t?) ? No tome nada que no est? en esta lista. ? Los pacientes con diabetes tipo 1 y tipo 2 solo deben [employer]. ? Llame a la cl?nica de [facility] o a la unidad de Same Day Surgery si  ?tiene alguna pregunta sobre estas instrucciones. ? ?            _X__ 2.Do Not Smoke or use e-cigarettes For 24 Hours Prior to Your Surgery.   ? Do not use any chewable tobacco products for at least 6  ? hours prior to surgery. ? ?  No fume ni use cigarrillos electr?nicos durante las 24 horas previas ?   a su cirug?a.  No use ning?n producto de tabaco masticable durante ?  al menos 6 horas antes de la cirug?a. ?   ? __X_ 3. No alcohol for 24 hours before or after surgery. ?   No tome alcohol durante las 24 horas  antes ni despu?s de la cirug?a. ? ? __X__4. On the morning of surgery brush your teeth with toothpaste and water, you ?               may rinse your mouth with mouthwash if you wish.  Do not swallow any toothpaste of mouthwash. ?  En la ma?ana de la cirug?a, cep?llese los dientes con pasta de dientes y agua, ?               puede enjuagarse la boca con enjuague bucal si lo desea. No ingiera ninguna pasta de dientes o enjuague bucal. ? ? __X__ 5. Notify your doctor if there is any change in your medical condition (cold,fever, infections). ?   Informe a su m?dico si hay alg?n cambio en su condici?n m?dica  ?(resfriado, fiebre, infecciones). ? ? Do not wear jewelry, make-up, hairpins, clips or nail polish. ? No use joyas, maquillajes, pinzas/ganchos para el cabello ni esmalte de u?as. ? Do not wear lotions, powders, or perfumes. You may wear deodorant. ? No use lociones, polvos o perfumes.  Puede usar desodorante.   ? Do not shave 48 hours prior to surgery. Men may shave  face and neck. ? No se afeite 48 horas antes de la cirug?a.  Los hombres pueden Commercial Metals Companyafeitarse la cara  y el cuello.  ? Do not bring valuables to the hospital.   ?No lleve objetos de valor al hospital. ? [facility] is not responsible for any belongings or valuables. ?  no se hace responsable de ning?n tipo de pertenencias u objetos de Licensed conveyancervalor. ?  ?            Contacts, dentures or bridgework may not be worn into surgery. ? Los lentes de contacto, las dentaduras postizas o puentes no se pueden usar en la cirug?a. ?  ?Leave your suitcase in the car. After surgery it may be brought to your room. ? Deje su maleta en el auto.  Despu?s de la cirug?a podr? traerla a su habitaci?n. ?  ?For patients admitted to the hospital, discharge time is determined by your ? treatment team. ? Para los pacientes que sean ingresados al hospital, el tiempo en el cual se le ? dar? de alta es determinado por su equipo de tratamiento. ?  ?Patients discharged the day of surgery will not be allowed to drive home. ?A los pacientes que se les da de alta el mismo d?a de la cirug?a no se les permitir? conducir a casa. ?  ?Please read over the following fact sheets that you were given: ?Por favor lea las siguientes hojas de informaci?n que le dieron:  ? Spine Surgery Book   ? ?__X__ Take these medicines the morning of surgery with A SIP OF WATER: ?         Tome estas medicinas la ma?ana de la cirug?a con UN SORBO DE AGUA: ? 1. levothyroxine (SYNTHROID) 75 MCG ? 2.  ? 3. ? 4.      ? 5. ? 6. ? ?____ Fleet Enema (as directed) ?         Enema de Fleet (seg?n lo indicado)   ? ?__X__ Use CHG Soap as directed ?         Utilice el jab?n de CHG seg?n lo indicado ? ?____ Use inhalers on the day of surgery ?         Use los inhaladores el d?a de la cirug?a ? ?____ Stop metformin 2 days prior to  surgery ?         Deje de tomar el metformin 2 d?as antes de la cirug?a   ? ?____ Take 1/2 of usual insulin dose the night before surgery and none on the morning  of surgery  ?         Tome la mitad de la dosis habitual de insulina la noche antes de la cirug?a y no tome nada en la ma?ana de la  ?           cirug?a ? ?__X__ Stop aspirin 05/26/21 ?         Deje de tomar el spirina el d?a: 05/26/21 ? ?__X__ Stop Anti-inflammatories such as Ibuprofen, Aleve, Advil, Motrin, Naprosyn, Meloxicam, Lodine, Ketroralac, midol, aspirins, Goody's and or BC Powders.  ?         Deje de tomar antiinflamatorios como Ibuprofen, Aleve, Advil, Motrin, Naprosyn, Meloxicam, Lodine, Ketroralac, midol, aspirinas, o polvos de Goody's and or BC Powders.  ?  ?__X__ Stop ALL supplements until after surgery   ?         Deje de tomar todos suplementos hasta despu?s de la cirug?a ? ?____ Bring C-Pap to the hospital ?         Lleve el C-Pap al hospital  ?

## 2021-05-31 ENCOUNTER — Other Ambulatory Visit: Payer: Medicare Other

## 2021-06-03 ENCOUNTER — Inpatient Hospital Stay
Admission: RE | Admit: 2021-06-03 | Discharge: 2021-06-05 | DRG: 520 | Disposition: A | Discharge status: Home Health | Payer: Medicare Other | Attending: Neurosurgery | Admitting: Neurosurgery

## 2021-06-03 ENCOUNTER — Encounter
Admission: RE | Disposition: A | Discharge status: Home Health | Payer: Self-pay | Source: Home / Self Care | Attending: Neurosurgery

## 2021-06-03 ENCOUNTER — Inpatient Hospital Stay: Payer: Medicare Other | Admitting: Certified Registered Nurse Anesthetist

## 2021-06-03 ENCOUNTER — Other Ambulatory Visit: Payer: Self-pay

## 2021-06-03 ENCOUNTER — Inpatient Hospital Stay: Payer: Medicare Other

## 2021-06-03 ENCOUNTER — Encounter: Payer: Self-pay | Admitting: Neurosurgery

## 2021-06-03 DIAGNOSIS — M48062 Spinal stenosis, lumbar region with neurogenic claudication: Secondary | ICD-10-CM | POA: Diagnosis present

## 2021-06-03 DIAGNOSIS — M48061 Spinal stenosis, lumbar region without neurogenic claudication: Principal | ICD-10-CM | POA: Diagnosis present

## 2021-06-03 DIAGNOSIS — M5416 Radiculopathy, lumbar region: Secondary | ICD-10-CM | POA: Diagnosis present

## 2021-06-03 HISTORY — PX: LUMBAR LAMINECTOMY/DECOMPRESSION MICRODISCECTOMY: SHX5026

## 2021-06-03 SURGERY — LUMBAR LAMINECTOMY/DECOMPRESSION MICRODISCECTOMY 4 LEVEL
Anesthesia: General | Site: Back

## 2021-06-03 MED ORDER — KETOROLAC TROMETHAMINE 30 MG/ML IJ SOLN
INTRAMUSCULAR | Status: DC | PRN
  Administered 2021-06-03: 30 mg via INTRAVENOUS

## 2021-06-03 MED ORDER — FENTANYL CITRATE (PF) 100 MCG/2ML IJ SOLN
INTRAMUSCULAR | Status: AC
  Filled 2021-06-03: qty 2

## 2021-06-03 MED ORDER — ONDANSETRON HCL 4 MG/2ML IJ SOLN
INTRAMUSCULAR | Status: DC | PRN
Start: 2021-06-03 — End: 2021-06-03
  Administered 2021-06-03: 4 mg via INTRAVENOUS

## 2021-06-03 MED ORDER — PROPOFOL 1000 MG/100ML IV EMUL
INTRAVENOUS | Status: AC
  Filled 2021-06-03: qty 100

## 2021-06-03 MED ORDER — LIDOCAINE HCL (CARDIAC) PF 100 MG/5ML IV SOSY
PREFILLED_SYRINGE | INTRAVENOUS | Status: DC | PRN
Start: 2021-06-03 — End: 2021-06-03
  Administered 2021-06-03: 80 mg via INTRAVENOUS

## 2021-06-03 MED ORDER — SUCCINYLCHOLINE CHLORIDE 200 MG/10ML IV SOSY
PREFILLED_SYRINGE | INTRAVENOUS | Status: AC
  Filled 2021-06-03: qty 10

## 2021-06-03 MED ORDER — ONDANSETRON HCL 4 MG/2ML IJ SOLN: 4.0000 mg | Freq: Once | INTRAMUSCULAR | Status: DC | PRN

## 2021-06-03 MED ORDER — PHENYLEPHRINE HCL-NACL 20-0.9 MG/250ML-% IV SOLN
INTRAVENOUS | Status: DC | PRN
Start: 2021-06-03 — End: 2021-06-03
  Administered 2021-06-03: 30 ug/min via INTRAVENOUS

## 2021-06-03 MED ORDER — FENTANYL CITRATE (PF) 100 MCG/2ML IJ SOLN
25.0000 ug | INTRAMUSCULAR | Status: DC | PRN
  Administered 2021-06-03 (×3): 25 ug via INTRAVENOUS

## 2021-06-03 MED ORDER — REMIFENTANIL HCL 1 MG IV SOLR
INTRAVENOUS | Status: AC
  Filled 2021-06-03: qty 1000

## 2021-06-03 MED ORDER — METHOCARBAMOL 500 MG PO TABS
500.0000 mg | ORAL_TABLET | Freq: Four times a day (QID) | ORAL | Status: DC | PRN
  Administered 2021-06-03 – 2021-06-04 (×2): 500 mg via ORAL
  Filled 2021-06-03 (×2): qty 1

## 2021-06-03 MED ORDER — PROPOFOL 10 MG/ML IV BOLUS
INTRAVENOUS | Status: DC | PRN
Start: 2021-06-03 — End: 2021-06-03
  Administered 2021-06-03: 160 mg via INTRAVENOUS
  Administered 2021-06-03: 40 mg via INTRAVENOUS

## 2021-06-03 MED ORDER — OXYCODONE HCL 5 MG PO TABS
10.0000 mg | ORAL_TABLET | ORAL | Status: DC | PRN
  Administered 2021-06-04: 10 mg via ORAL
  Filled 2021-06-03: qty 2

## 2021-06-03 MED ORDER — FAMOTIDINE 20 MG PO TABS
ORAL_TABLET | ORAL | Status: AC
  Administered 2021-06-03: 20 mg via ORAL
  Filled 2021-06-03: qty 1

## 2021-06-03 MED ORDER — MENTHOL 3 MG MT LOZG: 1.0000 | LOZENGE | OROMUCOSAL | Status: DC | PRN

## 2021-06-03 MED ORDER — FENTANYL CITRATE (PF) 100 MCG/2ML IJ SOLN
INTRAMUSCULAR | Status: DC | PRN
  Administered 2021-06-03: 25 ug via INTRAVENOUS
  Administered 2021-06-03 (×2): 50 ug via INTRAVENOUS
  Administered 2021-06-03: 25 ug via INTRAVENOUS

## 2021-06-03 MED ORDER — REMIFENTANIL HCL 1 MG IV SOLR
INTRAVENOUS | Status: DC | PRN
  Administered 2021-06-03: .1 ug/kg/min via INTRAVENOUS

## 2021-06-03 MED ORDER — CEFAZOLIN SODIUM-DEXTROSE 2-4 GM/100ML-% IV SOLN
2.0000 g | INTRAVENOUS | Status: AC
  Administered 2021-06-03: 2 g via INTRAVENOUS

## 2021-06-03 MED ORDER — ROSUVASTATIN CALCIUM 5 MG PO TABS
5.0000 mg | ORAL_TABLET | Freq: Every day | ORAL | Status: DC
  Administered 2021-06-03 – 2021-06-05 (×3): 5 mg via ORAL
  Filled 2021-06-03 (×3): qty 1

## 2021-06-03 MED ORDER — LEVOTHYROXINE SODIUM 50 MCG PO TABS
75.0000 ug | ORAL_TABLET | Freq: Every day | ORAL | Status: DC
  Administered 2021-06-04 – 2021-06-05 (×2): 75 ug via ORAL
  Filled 2021-06-03 (×2): qty 2

## 2021-06-03 MED ORDER — METHYLPREDNISOLONE ACETATE 40 MG/ML IJ SUSP
INTRAMUSCULAR | Status: AC
  Filled 2021-06-03: qty 1

## 2021-06-03 MED ORDER — OXYCODONE HCL 5 MG PO TABS: 5.0000 mg | ORAL_TABLET | Freq: Once | ORAL | Status: DC | PRN

## 2021-06-03 MED ORDER — SURGIFLO WITH THROMBIN (HEMOSTATIC MATRIX KIT) OPTIME
TOPICAL | Status: DC | PRN
Start: 2021-06-03 — End: 2021-06-03
  Administered 2021-06-03: 1 via TOPICAL

## 2021-06-03 MED ORDER — LACTATED RINGERS IV SOLN: INTRAVENOUS | Status: DC

## 2021-06-03 MED ORDER — SODIUM CHLORIDE FLUSH 0.9 % IV SOLN
INTRAVENOUS | Status: AC
  Filled 2021-06-03: qty 10

## 2021-06-03 MED ORDER — BUPIVACAINE HCL (PF) 0.5 % IJ SOLN
INTRAMUSCULAR | Status: AC
Start: 2021-06-03 — End: ?
  Filled 2021-06-03: qty 30

## 2021-06-03 MED ORDER — SODIUM CHLORIDE 0.9% FLUSH: 3.0000 mL | INTRAVENOUS | Status: DC | PRN

## 2021-06-03 MED ORDER — CHLORHEXIDINE GLUCONATE 0.12 % MT SOLN
OROMUCOSAL | Status: AC
  Administered 2021-06-03: 15 mL via OROMUCOSAL
  Filled 2021-06-03: qty 15

## 2021-06-03 MED ORDER — TAMSULOSIN HCL 0.4 MG PO CAPS
0.4000 mg | ORAL_CAPSULE | Freq: Every day | ORAL | Status: DC
  Administered 2021-06-03 – 2021-06-05 (×3): 0.4 mg via ORAL
  Filled 2021-06-03 (×3): qty 1

## 2021-06-03 MED ORDER — SUCCINYLCHOLINE CHLORIDE 200 MG/10ML IV SOSY
PREFILLED_SYRINGE | INTRAVENOUS | Status: DC | PRN
  Administered 2021-06-03: 80 mg via INTRAVENOUS

## 2021-06-03 MED ORDER — LIDOCAINE HCL (PF) 2 % IJ SOLN
INTRAMUSCULAR | Status: AC
  Filled 2021-06-03: qty 10

## 2021-06-03 MED ORDER — HYDROCHLOROTHIAZIDE 12.5 MG PO TABS
12.5000 mg | ORAL_TABLET | Freq: Every day | ORAL | Status: DC
  Administered 2021-06-03 – 2021-06-05 (×3): 12.5 mg via ORAL
  Filled 2021-06-03 (×3): qty 1

## 2021-06-03 MED ORDER — DEXAMETHASONE SODIUM PHOSPHATE 10 MG/ML IJ SOLN
INTRAMUSCULAR | Status: DC | PRN
  Administered 2021-06-03: 10 mg via INTRAVENOUS

## 2021-06-03 MED ORDER — SODIUM CHLORIDE 0.9 % IV SOLN: INTRAVENOUS | Status: DC

## 2021-06-03 MED ORDER — CEFAZOLIN SODIUM-DEXTROSE 2-4 GM/100ML-% IV SOLN
INTRAVENOUS | Status: AC
  Filled 2021-06-03: qty 100

## 2021-06-03 MED ORDER — 0.9 % SODIUM CHLORIDE (POUR BTL) OPTIME
TOPICAL | Status: DC | PRN
  Administered 2021-06-03: 1000 mL

## 2021-06-03 MED ORDER — THIAMINE HCL 100 MG PO TABS
250.0000 mg | ORAL_TABLET | Freq: Every day | ORAL | Status: DC
  Administered 2021-06-03 – 2021-06-05 (×3): 250 mg via ORAL
  Filled 2021-06-03 (×3): qty 3

## 2021-06-03 MED ORDER — SODIUM CHLORIDE (PF) 0.9 % IJ SOLN
INTRAMUSCULAR | Status: AC
  Filled 2021-06-03: qty 20

## 2021-06-03 MED ORDER — ACETAMINOPHEN 10 MG/ML IV SOLN
INTRAVENOUS | Status: AC
  Filled 2021-06-03: qty 100

## 2021-06-03 MED ORDER — POLYETHYLENE GLYCOL 3350 17 G PO PACK
17.0000 g | PACK | Freq: Every day | ORAL | Status: DC | PRN
  Administered 2021-06-03: 17 g via ORAL
  Filled 2021-06-03: qty 1

## 2021-06-03 MED ORDER — SODIUM CHLORIDE 0.9 % IV SOLN: 250.0000 mL | INTRAVENOUS | Status: DC

## 2021-06-03 MED ORDER — ACETAMINOPHEN 500 MG PO TABS
1000.0000 mg | ORAL_TABLET | Freq: Four times a day (QID) | ORAL | Status: AC
  Administered 2021-06-03 – 2021-06-04 (×4): 1000 mg via ORAL
  Filled 2021-06-03 (×5): qty 2

## 2021-06-03 MED ORDER — FAMOTIDINE 20 MG PO TABS: 20.0000 mg | ORAL_TABLET | Freq: Once | ORAL | Status: AC

## 2021-06-03 MED ORDER — KETOROLAC TROMETHAMINE 15 MG/ML IJ SOLN
7.5000 mg | Freq: Four times a day (QID) | INTRAMUSCULAR | Status: AC
  Administered 2021-06-03 – 2021-06-04 (×4): 7.5 mg via INTRAVENOUS
  Filled 2021-06-03 (×4): qty 1

## 2021-06-03 MED ORDER — SODIUM CHLORIDE 0.9% FLUSH
3.0000 mL | Freq: Two times a day (BID) | INTRAVENOUS | Status: DC
  Administered 2021-06-03 – 2021-06-05 (×5): 3 mL via INTRAVENOUS

## 2021-06-03 MED ORDER — EPHEDRINE SULFATE (PRESSORS) 50 MG/ML IJ SOLN
INTRAMUSCULAR | Status: DC | PRN
  Administered 2021-06-03: 10 mg via INTRAVENOUS

## 2021-06-03 MED ORDER — OXYCODONE HCL 5 MG/5ML PO SOLN: 5.0000 mg | Freq: Once | ORAL | Status: DC | PRN

## 2021-06-03 MED ORDER — PROPOFOL 500 MG/50ML IV EMUL
INTRAVENOUS | Status: DC | PRN
  Administered 2021-06-03: 150 ug/kg/min via INTRAVENOUS

## 2021-06-03 MED ORDER — MIDAZOLAM HCL 2 MG/2ML IJ SOLN
INTRAMUSCULAR | Status: DC | PRN
  Administered 2021-06-03: 2 mg via INTRAVENOUS

## 2021-06-03 MED ORDER — BISACODYL 10 MG RE SUPP: 10.0000 mg | Freq: Every day | RECTAL | Status: DC | PRN

## 2021-06-03 MED ORDER — ONDANSETRON HCL 4 MG/2ML IJ SOLN: 4.0000 mg | Freq: Four times a day (QID) | INTRAMUSCULAR | Status: DC | PRN

## 2021-06-03 MED ORDER — PHENOL 1.4 % MT LIQD: 1.0000 | OROMUCOSAL | Status: DC | PRN

## 2021-06-03 MED ORDER — FENTANYL CITRATE (PF) 100 MCG/2ML IJ SOLN
INTRAMUSCULAR | Status: AC
  Administered 2021-06-03: 25 ug via INTRAVENOUS
  Filled 2021-06-03: qty 2

## 2021-06-03 MED ORDER — ACETAMINOPHEN 10 MG/ML IV SOLN: 1000.0000 mg | Freq: Once | INTRAVENOUS | Status: DC | PRN

## 2021-06-03 MED ORDER — LACTATED RINGERS IV SOLN: INTRAVENOUS | Status: DC | PRN

## 2021-06-03 MED ORDER — METHYLPREDNISOLONE ACETATE 40 MG/ML IJ SUSP
INTRAMUSCULAR | Status: DC | PRN
  Administered 2021-06-03: 40 mg

## 2021-06-03 MED ORDER — SODIUM CHLORIDE (PF) 0.9 % IJ SOLN
INTRAMUSCULAR | Status: DC | PRN
  Administered 2021-06-03: 60 mL

## 2021-06-03 MED ORDER — ORAL CARE MOUTH RINSE: 15.0000 mL | Freq: Once | OROMUCOSAL | Status: AC

## 2021-06-03 MED ORDER — METHOCARBAMOL 1000 MG/10ML IJ SOLN: 500.0000 mg | Freq: Four times a day (QID) | INTRAVENOUS | Status: DC | PRN

## 2021-06-03 MED ORDER — DEXAMETHASONE SODIUM PHOSPHATE 10 MG/ML IJ SOLN
INTRAMUSCULAR | Status: AC
  Filled 2021-06-03: qty 2

## 2021-06-03 MED ORDER — FLEET ENEMA 7-19 GM/118ML RE ENEM: 1.0000 | ENEMA | Freq: Once | RECTAL | Status: DC | PRN

## 2021-06-03 MED ORDER — GLYCOPYRROLATE 0.2 MG/ML IJ SOLN
INTRAMUSCULAR | Status: AC
  Filled 2021-06-03: qty 2

## 2021-06-03 MED ORDER — KETOROLAC TROMETHAMINE 30 MG/ML IJ SOLN
INTRAMUSCULAR | Status: AC
  Filled 2021-06-03: qty 1

## 2021-06-03 MED ORDER — GLYCOPYRROLATE 0.2 MG/ML IJ SOLN
INTRAMUSCULAR | Status: DC | PRN
  Administered 2021-06-03: .2 mg via INTRAVENOUS

## 2021-06-03 MED ORDER — CHLORHEXIDINE GLUCONATE 0.12 % MT SOLN: 15.0000 mL | Freq: Once | OROMUCOSAL | Status: AC

## 2021-06-03 MED ORDER — ONDANSETRON HCL 4 MG PO TABS: 4.0000 mg | ORAL_TABLET | Freq: Four times a day (QID) | ORAL | Status: DC | PRN

## 2021-06-03 MED ORDER — SENNA 8.6 MG PO TABS
1.0000 | ORAL_TABLET | Freq: Two times a day (BID) | ORAL | Status: DC
  Administered 2021-06-03 – 2021-06-05 (×5): 8.6 mg via ORAL
  Filled 2021-06-03 (×5): qty 1

## 2021-06-03 MED ORDER — LOSARTAN POTASSIUM 50 MG PO TABS
50.0000 mg | ORAL_TABLET | Freq: Every day | ORAL | Status: DC
  Administered 2021-06-03 – 2021-06-05 (×3): 50 mg via ORAL
  Filled 2021-06-03 (×3): qty 1

## 2021-06-03 MED ORDER — OXYCODONE HCL 5 MG PO TABS
5.0000 mg | ORAL_TABLET | ORAL | Status: DC | PRN
  Administered 2021-06-03 – 2021-06-05 (×6): 5 mg via ORAL
  Filled 2021-06-03 (×6): qty 1

## 2021-06-03 MED ORDER — PREDNISOLONE ACETATE 1 % OP SUSP
1.0000 [drp] | Freq: Every day | OPHTHALMIC | Status: DC
  Administered 2021-06-03 – 2021-06-05 (×3): 1 [drp] via OPHTHALMIC
  Filled 2021-06-03: qty 1

## 2021-06-03 MED ORDER — PHENYLEPHRINE HCL-NACL 20-0.9 MG/250ML-% IV SOLN
INTRAVENOUS | Status: AC
  Filled 2021-06-03: qty 250

## 2021-06-03 MED ORDER — BUPIVACAINE-EPINEPHRINE (PF) 0.5% -1:200000 IJ SOLN
INTRAMUSCULAR | Status: DC | PRN
  Administered 2021-06-03: 5 mL

## 2021-06-03 MED ORDER — EPHEDRINE 5 MG/ML INJ
INTRAVENOUS | Status: AC
  Filled 2021-06-03: qty 5

## 2021-06-03 MED ORDER — MORPHINE SULFATE (PF) 2 MG/ML IV SOLN: 2.0000 mg | INTRAVENOUS | Status: DC | PRN

## 2021-06-03 MED ORDER — ONDANSETRON HCL 4 MG/2ML IJ SOLN
INTRAMUSCULAR | Status: AC
  Filled 2021-06-03: qty 4

## 2021-06-03 MED ORDER — MIDAZOLAM HCL 2 MG/2ML IJ SOLN
INTRAMUSCULAR | Status: AC
  Filled 2021-06-03: qty 2

## 2021-06-03 MED ORDER — ACETAMINOPHEN 10 MG/ML IV SOLN
INTRAVENOUS | Status: DC | PRN
  Administered 2021-06-03: 1000 mg via INTRAVENOUS

## 2021-06-03 MED ORDER — GABAPENTIN 100 MG PO CAPS
100.0000 mg | ORAL_CAPSULE | Freq: Two times a day (BID) | ORAL | Status: DC
  Administered 2021-06-03 – 2021-06-05 (×5): 100 mg via ORAL
  Filled 2021-06-03 (×5): qty 1

## 2021-06-03 SURGICAL SUPPLY — 63 items
ADH SKN CLS APL DERMABOND .7 (GAUZE/BANDAGES/DRESSINGS) ×1
AGENT HMST KT MTR STRL THRMB (HEMOSTASIS) ×1
APL PRP STRL LF DISP 70% ISPRP (MISCELLANEOUS) ×2
BUR NEURO DRILL SOFT 3.0X3.8M (BURR) ×2 IMPLANT
CHLORAPREP W/TINT 26 (MISCELLANEOUS) ×4 IMPLANT
CNTNR SPEC 2.5X3XGRAD LEK (MISCELLANEOUS) ×1
CONT SPEC 4OZ STER OR WHT (MISCELLANEOUS) ×1
CONT SPEC 4OZ STRL OR WHT (MISCELLANEOUS) ×1
CONTAINER SPEC 2.5X3XGRAD LEK (MISCELLANEOUS) ×1 IMPLANT
COUNTER NEEDLE 20/40 LG (NEEDLE) ×2 IMPLANT
CUP MEDICINE 2OZ PLAST GRAD ST (MISCELLANEOUS) ×4 IMPLANT
DERMABOND ADVANCED (GAUZE/BANDAGES/DRESSINGS) ×1
DERMABOND ADVANCED .7 DNX12 (GAUZE/BANDAGES/DRESSINGS) ×1 IMPLANT
DRAPE C ARM PK CFD 31 SPINE (DRAPES) ×2 IMPLANT
DRAPE LAPAROTOMY 100X77 ABD (DRAPES) ×2 IMPLANT
DRAPE MICROSCOPE SPINE 48X150 (DRAPES) ×2 IMPLANT
DRAPE SURG 17X11 SM STRL (DRAPES) ×5 IMPLANT
DRSG OPSITE POSTOP 4X8 (GAUZE/BANDAGES/DRESSINGS) ×1 IMPLANT
DRSG TEGADERM 2-3/8X2-3/4 SM (GAUZE/BANDAGES/DRESSINGS) ×1 IMPLANT
ELECT CAUTERY BLADE TIP 2.5 (TIP) ×2
ELECT EZSTD 165MM 6.5IN (MISCELLANEOUS) ×2
ELECTRODE CAUTERY BLDE TIP 2.5 (TIP) ×1 IMPLANT
ELECTRODE EZSTD 165MM 6.5IN (MISCELLANEOUS) ×1 IMPLANT
GAUZE 4X4 16PLY ~~LOC~~+RFID DBL (SPONGE) ×2 IMPLANT
GLOVE SURG SYN 6.5 ES PF (GLOVE) ×6 IMPLANT
GLOVE SURG SYN 6.5 PF PI (GLOVE) ×1 IMPLANT
GLOVE SURG SYN 8.5  E (GLOVE) ×6
GLOVE SURG SYN 8.5 E (GLOVE) ×3 IMPLANT
GLOVE SURG SYN 8.5 PF PI (GLOVE) ×3 IMPLANT
GLOVE SURG UNDER POLY LF SZ6.5 (GLOVE) ×4 IMPLANT
GOWN SRG LRG LVL 4 IMPRV REINF (GOWNS) ×1 IMPLANT
GOWN SRG XL LVL 3 NONREINFORCE (GOWNS) ×1 IMPLANT
GOWN STRL NON-REIN TWL XL LVL3 (GOWNS) ×2
GOWN STRL REIN LRG LVL4 (GOWNS) ×2
GOWN STRL REUS W/ TWL XL LVL3 (GOWN DISPOSABLE) ×1 IMPLANT
GOWN STRL REUS W/TWL XL LVL3 (GOWN DISPOSABLE) ×2
GRADUATE 1200CC STRL 31836 (MISCELLANEOUS) ×2 IMPLANT
HEMOVAC 400CC 10FR (MISCELLANEOUS) ×1 IMPLANT
HOLDER FOLEY CATH W/STRAP (MISCELLANEOUS) ×1 IMPLANT
KIT SPINAL PRONEVIEW (KITS) ×2 IMPLANT
MANIFOLD NEPTUNE II (INSTRUMENTS) ×2 IMPLANT
MARKER SKIN DUAL TIP RULER LAB (MISCELLANEOUS) ×2 IMPLANT
NDL SAFETY ECLIPSE 18X1.5 (NEEDLE) IMPLANT
NEEDLE HYPO 18GX1.5 SHARP (NEEDLE) ×2
NEEDLE HYPO 22GX1.5 SAFETY (NEEDLE) ×2 IMPLANT
NS IRRIG 1000ML POUR BTL (IV SOLUTION) ×2 IMPLANT
PACK LAMINECTOMY NEURO (CUSTOM PROCEDURE TRAY) ×2 IMPLANT
PAD ARMBOARD 7.5X6 YLW CONV (MISCELLANEOUS) ×2 IMPLANT
SPONGE GAUZE 2X2 8PLY STRL LF (GAUZE/BANDAGES/DRESSINGS) ×1 IMPLANT
SURGIFLO W/THROMBIN 8M KIT (HEMOSTASIS) ×2 IMPLANT
SUT DVC VLOC 3-0 CL 6 P-12 (SUTURE) ×2 IMPLANT
SUT ETHILON 3-0 FS-10 30 BLK (SUTURE) ×2
SUT VIC AB 0 CT1 27 (SUTURE) ×4
SUT VIC AB 0 CT1 27XCR 8 STRN (SUTURE) ×1 IMPLANT
SUT VIC AB 2-0 CT1 18 (SUTURE) ×3 IMPLANT
SUTURE EHLN 3-0 FS-10 30 BLK (SUTURE) IMPLANT
SYR 20ML LL LF (SYRINGE) ×2 IMPLANT
SYR 30ML LL (SYRINGE) ×4 IMPLANT
SYR 3ML LL SCALE MARK (SYRINGE) ×2 IMPLANT
TOWEL OR 17X26 4PK STRL BLUE (TOWEL DISPOSABLE) ×6 IMPLANT
TRAY FOLEY SLVR 16FR LF STAT (SET/KITS/TRAYS/PACK) ×1 IMPLANT
TUBING CONNECTING 10 (TUBING) ×2 IMPLANT
WATER STERILE IRR 500ML POUR (IV SOLUTION) ×1 IMPLANT

## 2021-06-03 NOTE — Progress Notes (Signed)
?   06/03/21 0700  ?Clinical Encounter Type  ?Visited With Patient and family together  ?Visit Type Initial;Pre-op  ?Spiritual Encounters  ?Spiritual Needs Prayer  ? ?Chaplain supported patient and family through compassionate presence, conversation and prayer. ?

## 2021-06-03 NOTE — Anesthesia Preprocedure Evaluation (Addendum)
Anesthesia Evaluation  ?Patient identified by MRN, date of birth, ID band ?Patient awake ? ? ? ?Reviewed: ?Allergy & Precautions, NPO status , Patient's Chart, lab work & pertinent test results ? ?History of Anesthesia Complications ?Negative for: history of anesthetic complications ? ?Airway ?Mallampati: III ? ? ?Neck ROM: Full ? ? ? Dental ? ?(+)  ?Missing molars x3:   ?Pulmonary ?neg pulmonary ROS,  ?  ?Pulmonary exam normal ?breath sounds clear to auscultation ? ? ? ? ? ? Cardiovascular ?hypertension, Normal cardiovascular exam ?Rhythm:Regular Rate:Normal ? ?ECG 05/22/21:  ?Sinus bradycardia (HR 51) ?Otherwise normal ECG ?  ?Neuro/Psych ?Chronic pain ?  ? GI/Hepatic ?negative GI ROS,   ?Endo/Other  ?Hypothyroidism Prediabetes, obesity ? Renal/GU ?negative Renal ROS  ? ?  ?Musculoskeletal ? ? Abdominal ?  ?Peds ? Hematology ?negative hematology ROS ?(+)   ?Anesthesia Other Findings ? ? Reproductive/Obstetrics ? ?  ? ? ? ? ? ? ? ? ? ? ? ? ? ?  ?  ? ? ? ? ? ? ? ?Anesthesia Physical ?Anesthesia Plan ? ?ASA: 2 ? ?Anesthesia Plan: General  ? ?Post-op Pain Management:   ? ?Induction: Intravenous ? ?PONV Risk Score and Plan: 2 and Ondansetron, Dexamethasone and Treatment may vary due to age or medical condition ? ?Airway Management Planned: Oral ETT ? ?Additional Equipment:  ? ?Intra-op Plan:  ? ?Post-operative Plan: Extubation in OR ? ?Informed Consent: I have reviewed the patients History and Physical, chart, labs and discussed the procedure including the risks, benefits and alternatives for the proposed anesthesia with the patient or authorized representative who has indicated his/her understanding and acceptance.  ? ? ? ?Dental advisory given ? ?Plan Discussed with: CRNA ? ?Anesthesia Plan Comments: (Patient consented for risks of anesthesia including but not limited to:  ?- adverse reactions to medications ?- damage to eyes, teeth, lips or other oral mucosa ?- nerve damage due to  positioning  ?- sore throat or hoarseness ?- damage to heart, brain, nerves, lungs, other parts of body or loss of life ? ?Informed patient about role of CRNA in peri- and intra-operative care.  Patient voiced understanding.)  ? ? ? ? ? ? ?Anesthesia Quick Evaluation ? ?

## 2021-06-03 NOTE — Progress Notes (Signed)
?   06/03/21 1400  ?Clinical Encounter Type  ?Visited With Patient and family together  ?Visit Type Follow-up;Post-op  ?Spiritual Encounters  ?Spiritual Needs Prayer  ? ?Chaplain provided follow-up support post op ?

## 2021-06-03 NOTE — Anesthesia Postprocedure Evaluation (Signed)
Anesthesia Post Note ? ?Patient: Serigo Coutts [facility] ? ?Procedure(s) Performed: L3-S1 DECOMPRESSION, LEFT L2-3 MICRODISCECTOMY (Back) ? ?Patient location during evaluation: PACU ?Anesthesia Type: General ?Level of consciousness: awake and alert, oriented and patient cooperative ?Pain management: pain level controlled ?Vital Signs Assessment: post-procedure vital signs reviewed and stable ?Respiratory status: spontaneous breathing, nonlabored ventilation and respiratory function stable ?Cardiovascular status: blood pressure returned to baseline and stable ?Postop Assessment: adequate PO intake ?Anesthetic complications: no ? ? ?No notable events documented. ? ? ?Last Vitals:  ?Vitals:  ? 06/03/21 1200 06/03/21 1226  ?BP: 103/76 123/73  ?Pulse: (!) 59 (!) 56  ?Resp: 18 18  ?Temp:  36.5 ?C  ?SpO2: 95% 96%  ?  ?Last Pain:  ?Vitals:  ? 06/03/21 1205  ?TempSrc:   ?PainSc: 4   ? ? ?  ?  ?  ?  ?  ?  ? ?Darrin Nipper ? ? ? ? ?

## 2021-06-03 NOTE — Transfer of Care (Signed)
Immediate Anesthesia Transfer of Care Note ? ?Patient: Clarence Wolf [facility] ? ?Procedure(s) Performed: L3-S1 DECOMPRESSION, LEFT L2-3 MICRODISCECTOMY (Back) ? ?Patient Location: PACU ? ?Anesthesia Type:General ? ?Level of Consciousness: drowsy ? ?Airway & Oxygen Therapy: Patient Spontanous Breathing and Patient connected to face mask oxygen ? ?Post-op Assessment: Report given to RN and Post -op Vital signs reviewed and stable ? ?Post vital signs: Reviewed and stable ? ?Last Vitals:  ?Vitals Value Taken Time  ?BP 138/85 06/03/21 1100  ?Temp    ?Pulse 62 06/03/21 1102  ?Resp 15 06/03/21 1102  ?SpO2 100 % 06/03/21 1102  ?Vitals shown include unvalidated device data. ? ?Last Pain:  ?Vitals:  ? 06/03/21 0637  ?TempSrc: Oral  ?PainSc: 7   ?   ? ?Patients Stated Pain Goal: 0 (06/03/21 5284) ? ?Complications: No notable events documented. ?

## 2021-06-03 NOTE — H&P (Signed)
I have reviewed and confirmed my history and physical from 05/07/21 with no additions or changes. Plan for L3-S1 decompression, L L2-3 microdiscectomy.  Risks and benefits reviewed. ? ?Heart sounds normal no MRG. Chest Clear to Auscultation Bilaterally. ? ? ?  ? ?

## 2021-06-03 NOTE — Evaluation (Addendum)
Physical Therapy Evaluation ?Patient Details ?Name: Clarence Wolf [facility] ?MRN: [id number] ?DOB: 10-21-1950 ?Today's Date: 06/03/2021 ? ?History of Present Illness ? 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  ?Clinical Impression ? Pt seen for PT evaluation with professional in-person interpreter Kandis Cocking) utilized.  Pt's wife present for session. Prior to admission pt required assistance from wife for bed mobility & transfers but was ambulatory with [facility] without assistance. On this date pt is limited by back pain but still mobilizes well. Pt requires mod assist for bed mobility with max cuing for log rolling technique. Pt initially reports dizziness upon sitting EOB that dissipates; pt able to perform 3-/5 knee extension in sitting as well. Pt completes STS with min assist & ambulates in room with decreased gait speed with use of RW. Anticipate pt will progress well & can d/c home with HHPT f/u & pt & wife in agreement with this. ?   ? ?Recommendations for follow up therapy are one component of a multi-disciplinary discharge planning process, led by the attending physician.  Recommendations may be updated based on patient status, additional functional criteria and insurance authorization. ? ?Follow Up Recommendations Home health PT ? ?  ?Assistance Recommended at Discharge Intermittent Supervision/Assistance  ?Patient can return home with the following ? A little help with walking and/or transfers;A little help with bathing/dressing/bathroom;Assistance with cooking/housework;Help with stairs or ramp for entrance;Assist for transportation ? ?  ?Equipment Recommendations None recommended by PT (pt already has RW)  ?Recommendations for Other Services ?   OT consult ?  ?Functional Status Assessment Patient has had a recent decline in their functional status and  demonstrates the ability to make significant improvements in function in a reasonable and predictable amount of time.  ? ?  ?Precautions / Restrictions Precautions ?Precautions: Fall;Back ?Restrictions ?Weight Bearing Restrictions: No  ? ?  ? ?Mobility ? Bed Mobility ?Overal bed mobility: Needs Assistance ?Bed Mobility: Rolling, Sidelying to Sit ?Rolling: Min assist ?Sidelying to sit: Mod assist, HOB elevated ?  ?  ?  ?General bed mobility comments: cuing for log rolling technique, mod assist to upright trunk to sitting EOB, use of bed rails & HOB slightly elevated ?  ? ?Transfers ?Overall transfer level: Needs assistance ?Equipment used: Rolling walker (2 wheels) ?Transfers: Sit to/from Stand, Bed to chair/wheelchair/BSC ?Sit to Stand: Min assist ?  ?Step pivot transfers: Min assist ?  ?  ?  ?General transfer comment: cuing for safe hand placement ?  ? ?Ambulation/Gait ?Ambulation/Gait assistance: Min assist ?Gait Distance (Feet): 15 Feet ?Assistive device: Rolling walker (2 wheels) ?Gait Pattern/deviations: Decreased step length - right, Decreased step length - left, Decreased stride length ?Gait velocity: decreased ?  ?  ?  ? ?Stairs ?  ?  ?  ?  ?  ? ?Wheelchair Mobility ?  ? ?Modified Rankin (Stroke Patients Only) ?  ? ?  ? ?Balance Overall balance assessment: Needs assistance ?Sitting-balance support: Feet supported, Bilateral upper extremity supported ?Sitting balance-Leahy Scale: Fair ?  ?  ?Standing balance support: During functional activity, Bilateral upper extremity supported, Reliant on assistive device for balance ?Standing balance-Leahy Scale: Poor ?  ?  ?  ?  ?  ?  ?  ?  ?  ?  ?  ?  ?   ? ? ? ?Pertinent Vitals/Pain Pain Assessment ?Pain Assessment: 0-10 ?Pain Score: 5  ?Pain Location:  back ?Pain Descriptors / Indicators: Discomfort, Grimacing ?Pain Intervention(s): Monitored during session, Premedicated before session, Limited activity within patient's tolerance  ? ? ?Home Living Family/patient  expects to be discharged to:: Private residence ?Living Arrangements: Spouse/significant other;Children ?Available Help at Discharge: Family;Available 24 hours/day ?Type of Home: Mobile home ?Home Access: Stairs to enter ?Entrance Stairs-Rails: Right;Left ?Entrance Stairs-Number of Steps: 3 ?  ?Home Layout: One level ?Home Equipment: Gilmer MorCane - single point ?   ?  ?Prior Function Prior Level of Function : Independent/Modified Independent;Needs assist ?  ?  ?  ?  ?  ?  ?Mobility Comments: Pt reports he required assistance for bed mobility & STS but [facility]. Pt denies falls. ?  ?  ? ? ?Hand Dominance  ?   ? ?  ?Extremity/Trunk Assessment  ? Upper Extremity Assessment ?Upper Extremity Assessment: Overall WFL for tasks assessed ?  ? ?Lower Extremity Assessment ?Lower Extremity Assessment: Generalized weakness ?  ? ?   ?Communication  ? Communication: Interpreter utilized (utilized in-person interpreter Photographer(Martiza) as pt is spanish speaking)  ?Cognition Arousal/Alertness: Awake/alert ?Behavior [facility]: [facility] for tasks assessed/performed ?Overall Cognitive Status: Within Functional Limits for tasks assessed ?  ?  ?  ?  ?  ?  ?  ?  ?  ?  ?  ?  ?  ?  ?  ?  ?  ?  ?  ? ?  ?General Comments General comments (skin integrity, edema, etc.): Educated pt on back precautions but pt would benefit from ongoing education (no spanish handout to get pt on this date) ? ?  ?Exercises    ? ?Assessment/Plan  ?  ?PT Assessment Patient needs continued PT services  ?PT Problem List Decreased strength;Decreased mobility;Decreased safety awareness;Decreased range of motion;Decreased knowledge of precautions;Decreased activity tolerance;Cardiopulmonary status limiting activity;Decreased balance;Decreased knowledge of use of DME;Pain ? ?   ?  ?PT Treatment Interventions DME instruction;Therapeutic exercise;Gait training;Balance training;Stair training;Neuromuscular re-education;Functional mobility  training;Therapeutic activities;Patient/family education;Modalities;Manual techniques   ? ?PT Goals (Current goals can be found in the Care Plan section)  ?Acute Rehab PT Goals ?Patient Stated Goal: decreased pain, dance ?PT Goal Formulation: With patient ?Time For Goal Achievement: 06/17/21 ?Potential to Achieve Goals: Good ? ?  ?Frequency 7X/week ?  ? ? ?Co-evaluation   ?  ?  ?  ?  ? ? ?  ?AM-PAC PT "6 Clicks" Mobility  ?Outcome Measure Help needed turning from your back to your side while in a flat bed without using bedrails?: A Little ?Help needed moving from lying on your back to sitting on the side of a flat bed without using bedrails?: A Lot ?Help needed moving to and from a bed to a chair (including a wheelchair)?: A Little ?Help needed standing up from a chair using your arms (e.g., wheelchair or bedside chair)?: A Little ?Help needed to walk in hospital room?: A Little ?Help needed climbing 3-5 steps with a railing? : A Lot ?6 Click Score: 16 ? ?  ?End of Session Equipment Utilized During Treatment: Gait belt ?Activity Tolerance: Patient tolerated treatment well ?Patient left: in chair;with chair alarm set;with call bell/phone within reach;with family/visitor present ?Nurse Communication: Mobility status ?PT Visit Diagnosis: Unsteadiness on feet (R26.81);Muscle weakness (generalized) (M62.81);Difficulty in walking, not elsewhere classified (R26.2);Pain ?Pain - part of body:  (back) ?  ? ?Time: [id number] ?PT Time Calculation (min) (ACUTE ONLY): 45 min ? ? ?Charges:   PT Evaluation ?$PT Eval Low Complexity: 1 Low ?  PT Treatments ?$Therapeutic Activity: 23-37 mins ?  ?   ? ? ?Aleda Grana, PT, DPT ?06/03/21, 4:13 PM ? ? ?Sandi Mariscal ?06/03/2021, 3:48 PM ? ?

## 2021-06-03 NOTE — Anesthesia Procedure Notes (Signed)
Procedure Name: Intubation ?Date/Time: 06/03/2021 7:27 AM ?Performed by: Joanette Gula, Tauheedah Bok, CRNA ?Pre-anesthesia Checklist: Patient identified, Emergency Drugs available, Suction available and Patient being monitored ?Patient Re-evaluated:Patient Re-evaluated prior to induction ?Oxygen Delivery Method: Circle system utilized ?Preoxygenation: Pre-oxygenation with 100% oxygen ?Induction Type: IV induction ?Ventilation: Mask ventilation without difficulty ?Laryngoscope Size: McGraph and 4 ?Grade View: Grade I ?Tube type: Oral ?Number of attempts: 1 ?Airway Equipment and Method: Stylet ?Placement Confirmation: ETT inserted through vocal cords under direct vision, positive ETCO2 and breath sounds checked- equal and bilateral ?Secured at: 22 cm ?Tube secured with: Tape ?Dental Injury: Teeth and Oropharynx as per pre-operative assessment  ? ? ? ? ?

## 2021-06-03 NOTE — Op Note (Signed)
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 ?He failed conservative management prompting surgical intervention ? ?Findings: lumbar stenosis ? 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 ? ?EBL: 100 ml ?IVF: 1000 ml ?Drains: 1 placed ?Disposition: Extubated and Stable to PACU ?Complications: none ? ?A foley catheter was placed. ? ? ?Preoperative Note:  ? ?Risks of surgery discussed include: infection, bleeding, stroke, coma, death, paralysis, CSF leak, nerve/spinal cord injury, numbness, tingling, weakness, complex regional pain syndrome, recurrent stenosis and/or disc herniation, vascular injury, development of instability, neck/back pain, need for further surgery, persistent symptoms, development of deformity, and the risks of anesthesia. The patient understood these risks and agreed to proceed. ? ?Operative Note:  ? ?1. L3-S1 lumbar decompression including central laminectomy and bilateral medial facetectomies including foraminotomies ?2. Left L2-3 microdiscectomy ? ?The patient was then brought from the preoperative center with intravenous access established.  The patient underwent general anesthesia and endotracheal tube intubation, and was then rotated on the [place] rail top where all pressure points were appropriately padded.  The skin was then thoroughly cleansed.  Perioperative antibiotic prophylaxis was administered.  Sterile prep and drapes were then applied and a timeout was then observed.  C-arm was brought into the field under sterile conditions and under lateral visualization the L5-S1 interspace was identified and marked. ? ?The incision was marked and injected with local anesthetic. Once this was complete a 8 cm incision was opened with the use of a #10 blade knife.   ? ?The metrx tubes were sequentially  advanced and confirmed in position at L5-S1. An 32mm by 4mm tube was locked in place to the bed side attachment.  The microscope was then sterilely brought into the field and muscle creep was hemostased with a bipolar and resected with a pituitary rongeur.  A Bovie extender was then used to expose the spinous process and lamina.  Careful attention was placed to not violate the facet capsule. A 3 mm matchstick drill bit was then used to make a hemi-laminotomy trough until the ligamentum flavum was exposed.  This was extended to the base of the spinous process and to the contralateral side to remove all the central bone from each side.  Once this was complete and the underlying ligamentum flavum was visualized, it was dissected with a curette and resected with Kerrison rongeurs.  Extensive ligamentum hypertrophy was noted, requiring a substantial amount of time and care for removal.  The dura was identified and palpated. The kerrison rongeur was then used to remove the medial facet bilaterally until no compression was noted.  A balltip probe was used to confirm decompression of the ipsilateral S1 nerve root. ? ?Additional attention was paid to completion of the contralateral L5-S1 foraminotomy until the contralateral traversing nerve root was completely free.  Once this was complete, L5-S1 central decompression including medial facetectomy and foraminotomy was confirmed and decompression on both sides was confirmed. No CSF leak was noted. ? ?A Depo-Medrol soaked Gelfoam pledget was placed in the defect.  The wound was copiously irrigated. The tube system was then removed under microscopic visualization and hemostasis was obtained with a bipolar.   ? ?After performing the decompression at L5-S1, the metrx tubes were sequentially advanced and confirmed in position at L4-5. An 23mm by 58mm tube was locked in place to the bed side attachment.  Fluoroscopy was then removed from the field.  The microscope was then  sterilely brought into the field and muscle creep was hemostased with a bipolar and resected with a pituitary rongeur.  A Bovie extender was then used to expose the spinous process and lamina.  Careful attention was placed to not violate the facet capsule. A 3 mm matchstick drill bit was then used to make a hemi-laminotomy trough until the ligamentum flavum was exposed.  This was extended to the base of the spinous process and to the contralateral side to remove all the central bone from each side.  Once this was complete and the underlying ligamentum flavum was visualized, it was dissected with a curette and resected with Kerrison rongeurs.  Extensive ligamentum hypertrophy was noted, requiring a substantial amount of time and care for removal.  The dura was identified and palpated. The kerrison rongeur was then used to remove the medial facet bilaterally until no compression was noted.  A balltip probe was used to confirm decompression of the ipsilateral L5 nerve root. ? ?Additional attention was paid to completion of the contralateral foraminotomy until the contralateral L5 nerve root was completely free.  Once this was complete, L4-5 central decompression including medial facetectomy and foraminotomy was confirmed and decompression on both sides was confirmed. No CSF leak was noted. ? ?A Depo-Medrol soaked Gelfoam pledget was placed in the defect.  The wound was copiously irrigated. The tube system was then removed under microscopic visualization and hemostasis was obtained with a bipolar.   ? ?After performing the decompression at L4-5, the metrx tubes were sequentially advanced and confirmed in position at L3-4. An 18mm by 60mm tube was locked in place to the bed side attachment.  Fluoroscopy was then removed from the field.  The microscope was then sterilely brought into the field and muscle creep was hemostased with a bipolar and resected with a pituitary rongeur.  A Bovie extender was then used to expose  the spinous process and lamina.  Careful attention was placed to not violate the facet capsule. A 3 mm matchstick drill bit was then used to make a hemi-laminotomy trough until the ligamentum flavum was exposed.  This was extended to the base of the spinous process and to the contralateral side to remove all the central bone from each side.  Once this was complete and the underlying ligamentum flavum was visualized, it was dissected with a curette and resected with Kerrison rongeurs.  Extensive ligamentum hypertrophy was noted, requiring a substantial amount of time and care for removal.  The dura was identified and palpated. The kerrison rongeur was then used to remove the medial facet bilaterally until no compression was noted.  A balltip probe was used to confirm decompression of the ipsilateral L4 nerve root. ? ?Additional attention was paid to completion of the contralateral foraminotomy until the contralateral L4 nerve root was completely free.  Once this was complete, L3-4 central decompression including medial facetectomy and foraminotomy was confirmed and decompression on both sides was confirmed. No CSF leak was noted. ? ?A Depo-Medrol soaked Gelfoam pledget was placed in the defect.  The wound was copiously irrigated. The tube system was then removed under microscopic visualization and hemostasis was obtained with a bipolar.   ? ?After performing the decompression at L3-4, the metrx tubes were sequentially advanced and confirmed in position at L2-3. An 18mm by 60mm tube was locked in place to the bed side attachment.  Fluoroscopy was then removed from the field.  The microscope was then sterilely brought into the  field and muscle creep was hemostased with a bipolar and resected with a pituitary rongeur.  A Bovie extender was then used to expose the spinous process and lamina.  Careful attention was placed to not violate the facet capsule. A 3 mm matchstick drill bit was then used to make a hemi-laminotomy  trough until the ligamentum flavum was exposed.  This was extended to the base of the spinous process and to the contralateral side to remove all the central bone from each side.  Once this was complete and the

## 2021-06-03 NOTE — Progress Notes (Signed)
PHARMACY -  BRIEF ANTIBIOTIC NOTE  ? ?Pharmacy has received consult(s) for Cefazolin from an OR provider.  The patient's profile has been reviewed for ht/wt/allergies/indication/available labs.   ? ?One time order(s) placed for Cefazolin 2 gm ? ?Further antibiotics/pharmacy consults should be ordered by admitting physician if indicated.       ?                ?Thank you, ?Otelia Sergeant, PharmD, MBA ?06/03/2021 ?6:24 AM ? ? ?

## 2021-06-04 ENCOUNTER — Encounter: Payer: Self-pay | Admitting: Neurosurgery

## 2021-06-04 MED ORDER — ENOXAPARIN SODIUM 40 MG/0.4ML IJ SOSY
40.0000 mg | PREFILLED_SYRINGE | INTRAMUSCULAR | Status: DC
  Administered 2021-06-04: 40 mg via SUBCUTANEOUS
  Filled 2021-06-04: qty 0.4

## 2021-06-04 NOTE — Evaluation (Signed)
Occupational Therapy Evaluation ?Patient Details ?Name: Clarence Wolf [facility] ?MRN: [id number] ?DOB: October 11, 1950 ?Today's Date: 06/04/2021 ? ? 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  ? ?Clinical Impression ?  ?Patient presenting with decreased Ind in self care, balance, functional mobility/transfers, endurance, and safety awareness. Patient reports living at home with wife who is able to assist as needed at discharge. He declines use of interpreter this session. Pt unable to verbalize back precautions with therapist reviewing with pt related to self care tasks. Pt verbalized understanding. He was able to perform figure four position for LB self care. OT also demonstrated use of LH reacher to increase independence but he has some difficulty with that as well and reported with would help him. Supervision - min guard for functional transfers and self care tasks with use of RW. Patient will benefit from acute OT to increase overall independence in the areas of ADLs, functional mobility, and safety awareness in order to safely discharge home with family. ?   ? ?Recommendations for follow up therapy are one component of a multi-disciplinary discharge planning process, led by the attending physician.  Recommendations may be updated based on patient status, additional functional criteria and insurance authorization.  ? ?Follow Up Recommendations ? Home health OT  ?  ?Assistance Recommended at Discharge Intermittent Supervision/Assistance  ?Patient can return home with the following A little help with walking and/or transfers;A little help with bathing/dressing/bathroom;Help with stairs or ramp for entrance;Assist for transportation;Assistance with cooking/housework ? ?  ?Functional Status Assessment ? Patient has had a recent decline in their  functional status and demonstrates the ability to make significant improvements in function in a reasonable and predictable amount of time.  ?Equipment Recommendations ? Other (comment) (pt has all needed equipment)  ?  ?   ?Precautions / Restrictions Precautions ?Precautions: Fall;Back ?Restrictions ?Weight Bearing Restrictions: No  ? ?  ? ?Mobility Bed Mobility ?  ?  ?  ?  ?  ?  ?  ?General bed mobility comments: seated in recliner chair ?  ? ?Transfers ?Overall transfer level: Needs assistance ?Equipment used: Rolling walker (2 wheels) ?Transfers: Sit to/from Stand, Bed to chair/wheelchair/BSC ?Sit to Stand: Min guard ?  ?  ?Step pivot transfers: Min guard ?  ?  ?General transfer comment: cuing for safe hand placement ?  ? ?  ?Balance Overall balance assessment: Needs assistance ?Sitting-balance support: Feet supported, Bilateral upper extremity supported ?Sitting balance-Leahy Scale: Good ?  ?  ?Standing balance support: During functional activity, Bilateral upper extremity supported, Reliant on assistive device for balance ?Standing balance-Leahy Scale: Fair ?  ?  ?  ?  ?  ?  ?  ?  ?  ?  ?  ?  ?   ? ?ADL either performed or assessed with clinical judgement  ? ?ADL Overall ADL's : Needs assistance/impaired ?  ?  ?Grooming: Wash/dry hands;Wash/dry face;Supervision/safety;Standing ?  ?  ?  ?  ?  ?  ?  ?Lower Body Dressing: Minimal assistance;Sit to/from stand ?  ?  ?  ?  ?  ?  ?  ?  ?   ? ? ? ?Vision Patient Visual Report: No change from baseline ?   ?   ?   ?   ? ?Pertinent Vitals/Pain Pain Assessment ?Pain Assessment: No/denies pain ?Pain Score: 7  ?Pain Location: back ?  Pain Descriptors / Indicators: Discomfort, Grimacing ?Pain Intervention(s): Limited activity within patient's tolerance, Monitored during session, Premedicated before session, Repositioned  ? ? ? ?Hand Dominance Right ?  ?Extremity/Trunk Assessment Upper Extremity Assessment ?Upper Extremity Assessment: Overall WFL for tasks assessed ?  ?Lower  Extremity Assessment ?Lower Extremity Assessment: Defer to PT evaluation ?  ?  ?  ?Communication Communication ?Communication: Other (comment) (Pt declined interpreter) ?  ?Cognition Arousal/Alertness: Awake/alert ?Behavior During Therapy: [facility] for tasks assessed/performed ?Overall Cognitive Status: Within Functional Limits for tasks assessed ?  ?  ?  ?  ?  ?  ?  ?  ?  ?  ?  ?  ?  ?  ?  ?  ?  ?  ?  ?   ?   ?   ? ? ?Home Living Family/patient expects to be discharged to:: Private residence ?Living Arrangements: Spouse/significant other;Children ?Available Help at Discharge: Family;Available 24 hours/day ?Type of Home: Mobile home ?Home Access: Stairs to enter ?Entrance Stairs-Number of Steps: 3 ?Entrance Stairs-Rails: Right;Left ?Home Layout: One level ?  ?  ?Bathroom Shower/Tub: Tub/shower unit ?  ?  ?  ?  ?Home Equipment: Gilmer Mor - single point ?  ?  ?  ? ?  ?Prior Functioning/Environment Prior Level of Function : Independent/Modified Independent;Needs assist ?  ?  ?  ?  ?  ?  ?Mobility Comments: Pt reports he required assistance for bed mobility & STS but was able to ambulate with [facility] without assistance. Pt denies falls. ?ADLs Comments: Pt reports min A for LB dressing ?  ? ?  ?  ?OT Problem List: Decreased strength;Decreased activity tolerance;Impaired balance (sitting and/or standing);Decreased safety awareness;Decreased knowledge of use of DME or AE;Pain;Decreased knowledge of precautions ?  ?   ?OT Treatment/Interventions: Self-care/ADL training;Therapeutic exercise;Therapeutic activities;Energy conservation;DME and/or AE instruction;Manual therapy;Balance training;Patient/family education;Neuromuscular education;Modalities  ?  ?OT Goals(Current goals can be found in the care plan section) Acute Rehab OT Goals ?Patient Stated Goal: to return home ?OT Goal Formulation: With patient/family ?Time For Goal Achievement: 06/18/21 ?Potential to Achieve Goals: Fair ?ADL Goals ?Pt Will Perform Grooming: with modified  independence;standing ?Pt Will Perform Lower Body Dressing: with modified independence;sit to/from stand;with adaptive equipment ?Pt Will Transfer to Toilet: with modified independence;ambulating ?Pt Will Perform Toileting - Clothing Manipulation and hygiene: with modified independence;sit to/from stand  ?OT Frequency: Min 2X/week ?  ? ?   ?AM-PAC OT "6 Clicks" Daily Activity     ?Outcome Measure Help from another person eating meals?: None ?Help from another person taking care of personal grooming?: None ?Help from another person toileting, which includes using toliet, bedpan, or urinal?: A Little ?Help from another person bathing (including washing, rinsing, drying)?: A Little ?Help from another person to put on and taking off regular upper body clothing?: None ?Help from another person to put on and taking off regular lower body clothing?: A Little ?6 Click Score: 21 ?  ?End of Session Equipment Utilized During Treatment: Rolling walker (2 wheels) ?Nurse Communication: Mobility status ? ?Activity Tolerance: Patient tolerated treatment well ?Patient left: in bed;with call bell/phone within reach;with bed alarm set ? ?OT Visit Diagnosis: Unsteadiness on feet (R26.81);Repeated falls (R29.6);Muscle weakness (generalized) (M62.81)  ?              ?Time: 8250-0370 ?OT Time Calculation (min): 20 min ?Charges:  OT General Charges ?$OT Visit: 1 Visit ?OT Evaluation ?$OT Eval Moderate Complexity: 1 Mod ?OT Treatments ?$Self Care/Home Management : 8-22 mins ? ?Jackquline Denmark,  MS, OTR/L , CBIS ?ascom [phone]  ?06/04/21, 2:28 PM  ?

## 2021-06-04 NOTE — Plan of Care (Signed)

## 2021-06-04 NOTE — Discharge Instructions (Signed)
°  Your surgeon has performed an operation on your lumbar spine (low back) to relieve pressure on one or more nerves. Many times, patients feel better immediately after surgery and can “overdo it.” Even if you feel well, it is important that you follow these activity guidelines. If you do not let your back heal properly from the surgery, you can increase the chance of a disc herniation and/or return of your symptoms. The following are instructions to help in your recovery once you have been discharged from the hospital. ° ° °Activity  °  °No bending, lifting, or twisting (“BLT”). Avoid lifting objects heavier than 10 pounds (gallon milk jug).  Where possible, avoid household activities that involve lifting, bending, pushing, or pulling such as laundry, vacuuming, grocery shopping, and childcare. Try to arrange for help from friends and family for these activities while your back heals. ° °Increase physical activity slowly as tolerated.  Taking short walks is encouraged, but avoid strenuous exercise. Do not jog, run, bicycle, lift weights, or participate in any other exercises unless specifically allowed by your doctor. Avoid prolonged sitting, including car rides. ° °Talk to your doctor before resuming sexual activity. ° °You should not drive until cleared by your doctor. ° °Until released by your doctor, you should not return to work or school.  You should rest at home and let your body heal.  ° °You may shower two days after your surgery.  After showering, lightly dab your incision dry. Do not take a tub bath or go swimming for 3 weeks, or until approved by your doctor at your follow-up appointment. ° °If you smoke, we strongly recommend that you quit.  Smoking has been proven to interfere with normal healing in your back and will dramatically reduce the success rate of your surgery. Please contact [facility] (800-QUIT-NOW) and use the resources at www.[facility].com for assistance in stopping smoking. ° °Surgical  Incision °  °If you have a dressing on your incision, you may remove it three days after your surgery. Keep your incision area clean and dry. ° °Your incision was closed with Dermabond glue. The glue should begin to peel away within about a week. °Diet          ° ° You may return to your usual diet. Be sure to stay hydrated. ° °When to Contact Us ° °Although your surgery and recovery will likely be uneventful, you may have some residual numbness, aches, and pains in your back and/or legs. This is normal and should improve in the next few weeks. ° °However, should you experience any of the following, contact us immediately: °New numbness or weakness °Pain that is progressively getting worse, and is not relieved by your pain medications or rest °Bleeding, redness, swelling, pain, or drainage from surgical incision °Chills or flu-like symptoms °Fever greater than 101.0 F (38.3 C) °Problems with bowel or bladder functions °Difficulty breathing or shortness of breath °Warmth, tenderness, or swelling in your calf ° °Contact Information °During office hours (Monday-Friday 9 am to 5 pm), please call your physician at [phone] °After hours and weekends, please call [phone] and speak with the answering service, who will contact the doctor on call.  If that fails, call the [facility] Operator at [phone] and ask for the Neurosurgery Resident On Call  °For a life-threatening emergency, call 911  °

## 2021-06-04 NOTE — Discharge Summary (Signed)
Physician Discharge Summary  ?Patient ID: ?Clarence Wolf ?MRN: [id number] ?DOB/AGE: [birth year] 71 y.o. ? ?Admit date: 06/03/2021 ?Discharge date: 06/05/2021 ? ?Admission Diagnoses: Lumbar stenosis with Neurogenic claudication ? ?Discharge Diagnoses:  ?Principal Problem: ?  Lumbar stenosis ? ? ?Discharged Condition: good ? ?Hospital Course:  ?Clarence Wolf is a 71 y.o s/p L3-S1 decompression. Left L2-3 microdiscectomy on 06/03/21 without complications. He was admitted for drain monitoring and pain control. He was seen and evaluated by therapy and deemed appropriate for discharge home with home health PT and OT. His drain output reduced significantly on POD2 and was removed prior to discharge. He was given medications for pain muscle relaxant and stool softener.  ? ?Consults: None ? ?Significant Diagnostic Studies: none  ? ?Treatments: surgery: as above. Please see separately dictated operative report for further details  ? ?Discharge Exam: ?Blood pressure 118/83, pulse (!) 53, temperature 98 ?F (36.7 ?C), resp. rate 18, height 5\' 1"  (1.549 m), weight 80.7 kg, SpO2 99 %. ?AA Ox3 ambulating independently to the sink ?CNI ?  ?Strength:5/5 throughout BLE ?Incision c/d/i ? ?Disposition: Discharge disposition: 06-Home-Health Care Svc ? ? ? ? ? ? ?Discharge Instructions   ? ? Incentive spirometry RT   Complete by: As directed ?  ? Remove dressing in 24 hours   Complete by: As directed ?  ? ?  ? ?Allergies as of 06/05/2021   ?No Known Allergies ?  ? ?  ?Medication List  ?  ? ?STOP taking these medications   ? ?aspirin EC 81 MG tablet ?  ?meloxicam 15 MG tablet ?Commonly known as: MOBIC ?  ?methylPREDNISolone 4 MG Tbpk tablet ?Commonly known as: MEDROL DOSEPAK ?  ?tiZANidine 2 MG tablet ?Commonly known as: ZANAFLEX ?  ? ?  ? ?TAKE these medications   ? ?gabapentin 100 MG capsule ?Commonly known as: NEURONTIN ?Take 100 mg by mouth 2 (two) times daily. ?  ?hydrochlorothiazide 12.5 MG capsule ?Commonly known as: MICROZIDE ?Take  12.5 mg by mouth daily. ?  ?ibuprofen 200 MG tablet ?Commonly known as: ADVIL ?Take 200 mg by mouth every 6 (six) hours as needed. ?  ?levothyroxine 75 MCG tablet ?Commonly known as: SYNTHROID ?Take 75 mcg by mouth daily. ?  ?losartan 50 MG tablet ?Commonly known as: COZAAR ?Take 50 mg by mouth daily. ?  ?methocarbamol 500 MG tablet ?Commonly known as: ROBAXIN ?Take 1 tablet (500 mg total) by mouth every 6 (six) hours as needed for muscle spasms. ?  ?Omega 3 1000 MG Caps ?Take by mouth. ?  ?oxyCODONE 5 MG immediate release tablet ?Commonly known as: Oxy IR/ROXICODONE ?Take 1 tablet (5 mg total) by mouth every 3 (three) hours as needed for up to 5 days for moderate pain ((score 4 to 6)). ?  ?prednisoLONE acetate 1 % ophthalmic suspension ?Commonly known as: PRED FORTE ?Place 1 drop into both eyes daily. ?  ?rosuvastatin 5 MG tablet ?Commonly known as: CRESTOR ?Take 5 mg by mouth daily. ?  ?senna 8.6 MG Tabs tablet ?Commonly known as: SENOKOT ?Take 1 tablet (8.6 mg total) by mouth 2 (two) times daily as needed for mild constipation. ?  ?sildenafil 100 MG tablet ?Commonly known as: VIAGRA ?Take 100 mg by mouth as needed for erectile dysfunction. ?  ?tamsulosin 0.4 MG Caps capsule ?Commonly known as: FLOMAX ?Take 0.4 mg by mouth daily. ?  ?vitamin B-1 250 MG tablet ?Take 250 mg by mouth daily. ?  ?Vitamin C 500 MG Caps ?Take by mouth. ?  ?Vitamin D (  Cholecalciferol) 25 MCG (1000 UT) Tabs ?Take by mouth. ?  ? ?  ? ? Follow-up Information   ? ? Susanne Borders, PA Follow up in 2 week(s).   ?Why: For incision check and post-op follow up. This appointment should already be scheduled at the [facility]. Please check your pre-op paperwork for appointment date and time. Call the office with any questions ?Contact information: ?[address] ?[place] [address] ?[phone] ? ? ?  ?  ? ?  ?  ? ?  ? ? ?Signed: ?Susanne Borders ?06/05/2021, 7:31 AM ? ? ?

## 2021-06-04 NOTE — Progress Notes (Signed)
? ?   Attending Progress Note ? ?History: Clarence Wolf is s/p L3-S1 decompression. Left L2-3 microdiscectomy. ? ?POD1: Complains of some incisional pain today. Denies any leg pain. States he ambulated with PT yesterday morning  ? ?Physical Exam: ?Vitals:  ? 06/04/21 0327 06/04/21 0738  ?BP: 109/67 120/70  ?Pulse: (!) 51 (!) 49  ?Resp: 16 17  ?Temp: 97.9 ?F (36.6 ?C) 98.1 ?F (36.7 ?C)  ?SpO2: 98% 99%  ? ? ?AA Ox3 ?CNI ? ?Strength:5/5 throughout BLE ?HV output 90 since surgery. ? ?Data: ? ?No results for input(s): NA, K, CL, CO2, BUN, CREATININE, LABGLOM, GLUCOSE, CALCIUM in the last 168 hours. ?No results for input(s): AST, ALT, ALKPHOS in the last 168 hours. ? ?Invalid input(s): TBILI  ? No results for input(s): WBC, HGB, HCT, PLT in the last 168 hours. ?No results for input(s): APTT, INR in the last 168 hours.  ?   ? ? ?Other tests/results: none  ? ?Assessment/Plan: ? ?Clarence Wolf is a 71 y.o s/p lumbar decompression on [id number] without complications for neurogenic claudication. ? ?- mobilize ?- pain control ?- DVT prophylaxis ?- PTOT ?- Will re-evaluate HV removal this afternoon ? ?Cooper Render PA-C ?Department of Neurosurgery ? ?  ?

## 2021-06-04 NOTE — TOC Initial Note (Signed)
Transition of Care (TOC) - Initial/Assessment Note  ? ? ?Patient Details  ?Name: Clarence Wolf [facility] ?MRN: [id number] ?Date of Birth: 05-30-1950 ? ?Transition of Care (TOC) CM/SW Contact:    ?Francois Elk A Eldred Lievanos, LCSW ?Phone Number: ?06/04/2021, 3:15 PM ? ?Clinical Narrative:      CSW spoke with pt's spouse and they are agreeable to [facility] with no agency preference. CSW reached out to [place] and they can service with start date Saturday 4/1. Pt's spouse states pt already has a walker at home.           ? ? ?Expected Discharge Plan: Home w Home Health Services ?Barriers to Discharge: Continued Medical Work up ? ? ?Patient Goals and CMS Choice ?Patient states their goals for this hospitalization and ongoing recovery are:: for pt to dc home ?  ?Choice offered to / list presented to : Spouse ? ?Expected Discharge Plan and Services ?Expected Discharge Plan: Home w Home Health Services ?In-house Referral: Clinical Social Work ?  ?Post Acute Care Choice: Home Health ?Living arrangements for the past 2 months: Single Family Home ?                ?  ?  ?  ?  ?  ?HH Arranged: PT, OT ?HH Agency: [facility] ?Date HH Agency Contacted: 06/04/21 ?Time HH Agency Contacted: [phone] ?Representative spoke with at [facility] Agency: meg ? ?Prior Living Arrangements/Services ?Living arrangements for the past 2 months: Single Family Home ?Lives with:: Spouse ?Patient language and need for interpreter reviewed:: Yes ?Do you feel safe going back to the place where you live?: Yes      ?Need for Family Participation in Patient Care: Yes (Comment) ?Care giver support system in place?: Yes (comment) ?  ?Criminal Activity/Legal Involvement Pertinent to Current Situation/Hospitalization: No - Comment as needed ? ?Activities of Daily Living ?Home Assistive Devices/Equipment: Cane (specify quad or straight) ?ADL Screening (condition at time of admission) ?Patient's cognitive ability adequate to safely complete daily activities?: Yes ?Is the patient deaf or have  difficulty hearing?: No ?Does the patient have difficulty seeing, even when wearing glasses/contacts?: No ?Does the patient have difficulty concentrating, remembering, or making decisions?: No ?Patient able to express need for assistance with ADLs?: Yes ?Does the patient have difficulty dressing or bathing?: No ?Independently performs ADLs?: Yes (appropriate for developmental age) ?Does the patient have difficulty walking or climbing stairs?: No ?Weakness of Legs: Both ?Weakness of Arms/Hands: None ? ?Permission Sought/Granted ?Permission sought to share information with : Family Supports ?  ? Share Information with NAME: Clarence Wolf ?   ? Permission granted to share info w Relationship: spouse ?   ? ?Emotional Assessment ?Appearance:: Appears stated age ?Attitude/Demeanor/Rapport: Unable to Assess ?  ?Orientation: : Oriented to Place, Oriented to Self, Oriented to Situation, Oriented to  Time ?Alcohol / Substance Use: Not Applicable ?  ? ?Admission diagnosis:  Lumbar stenosis [M48.061] ?Patient Active Problem List  ? Diagnosis Date Noted  ? Lumbar stenosis 06/03/2021  ? Acquired hypothyroidism 06/01/2018  ? Chronic midline low back pain without sciatica 06/01/2018  ? History of fall 06/01/2018  ? Neuropathic pain of lower extremity, right 06/01/2018  ? Benign essential hypertension 01/09/2015  ? ?PCP:  [facility] ?Pharmacy:   ?[id number] - [place], [place] - [address] [place] [address] AT [address] ?[address] ?[place] [address] ?Phone: [phone] Fax: [phone] ? ? ? ? ?Social Determinants of Health (SDOH) Interventions ?  ? ?  Readmission Risk Interventions ?   ? View : No data to display.  ?  ?  ?  ? ? ? ?

## 2021-06-04 NOTE — Progress Notes (Signed)
Physical Therapy Treatment ?Patient Details ?Name: Clarence Wolf [facility] ?MRN: [id number] ?DOB: May 10, 1950 ?Today's Date: 06/04/2021 ? ? 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 ? ?  ?PT Comments  ? ? Pt did well with prolonged bout of ambulation, he was able to ambulate w/o AD/UEs but was safer and more stable with some UE support.  Pt did well with mobiltiy transitions keeping neutral spine and did not need any direct assist during the transitions.   Pt with some b/l hamstring weakness but otherwise functional t/o with safe in-home mobility effort this date.  ?Recommendations for follow up therapy are one component of a multi-disciplinary discharge planning process, led by the attending physician.  Recommendations may be updated based on patient status, additional functional criteria and insurance authorization. ? ?Follow Up Recommendations ? Home health PT ?  ?  ?Assistance Recommended at Discharge Intermittent Supervision/Assistance  ?Patient can return home with the following Assistance with cooking/housework;A little help with bathing/dressing/bathroom;Assist for transportation ?  ?Equipment Recommendations ?    ?  ?Recommendations for Other Services   ? ? ?  ?Precautions / Restrictions Precautions ?Precautions: Fall;Back ?Restrictions ?Weight Bearing Restrictions: No  ?  ? ?Mobility ? Bed Mobility ?Overal bed mobility: Needs Assistance ?Bed Mobility: Rolling, Sidelying to Sit ?Rolling: Supervision ?Sidelying to sit: Supervision ?  ?  ?  ?General bed mobility comments: Pt did well to slowly roll and maintain neutral spine ?  ? ?Transfers ?Overall transfer level: Needs assistance ?Equipment used: Rolling walker (2 wheels) ?Transfers: Sit to/from Stand ?Sit to Stand: Min guard ?  ?  ?  ?  ?  ?General transfer comment: Pt was able to  rise to standing and showed good overall effort and safety.  Not overly reliant on the walker. ?  ? ?Ambulation/Gait ?Ambulation/Gait assistance: Min assist ?Gait Distance (Feet): 350 Feet ?Assistive device: Rolling walker (2 wheels) ?  ?  ?  ?  ?General Gait Details: Pt was able to quickly assume consistent cadence and speed.  He was safest and most confident with RW, but was able to do some ambulation w/ just HHA and even no UEs with slower more guarded (yet still safe) effort. ? ? ?Stairs ?Stairs: Yes ?  ?Stair Management: Two rails ?Number of Stairs: 4 ?General stair comments: Pt was able to negotiate up/down steps safely and w/o issue using b/l rails ? ? ?Wheelchair Mobility ?  ? ?Modified Rankin (Stroke Patients Only) ?  ? ? ?  ?Balance Overall balance assessment: Needs assistance ?Sitting-balance support: Feet supported, Bilateral upper extremity supported ?Sitting balance-Leahy Scale: Good ?  ?  ?Standing balance support: Bilateral upper extremity supported, Reliant on assistive device for balance ?Standing balance-Leahy Scale: Good ?Standing balance comment: Pt did no have any LOBs, or overt unsteadiness.  More confident and consistent cadence with RW, but able to maintain balance w/o UEs during ambulation ?  ?  ?  ?  ?  ?  ?  ?  ?  ?  ?  ?  ? ?  ?Cognition Arousal/Alertness: Awake/alert ?Behavior During Therapy: [facility] for tasks assessed/performed ?Overall Cognitive Status: Within Functional Limits for tasks assessed ?  ?  ?  ?  ?  ?  ?  ?  ?  ?  ?  ?  ?  ?  ?  ?  ?  ?  ?  ? ?  ?  Exercises   ? ?  ?General Comments General comments (skin integrity, edema, etc.): Pt initially somewhat hesitant to do much, but quickly gained confidence and did very well with mobility ?  ?  ? ?Pertinent Vitals/Pain Pain Assessment ?Pain Assessment: 0-10 ?Pain Score: 5  ?Pain Location: back  ? ? ?Home Living Family/patient expects to be discharged to:: Private residence ?Living Arrangements: Spouse/significant  other;Children ?Available Help at Discharge: Family;Available 24 hours/day ?Type of Home: Mobile home ?Home Access: Stairs to enter ?Entrance Stairs-Rails: Right;Left ?Entrance Stairs-Number of Steps: 3 ?  ?Home Layout: One level ?Home Equipment: Gilmer Mor - single point ?   ?  ?Prior Function    ?  ?  ?   ? ?PT Goals (current goals can now be found in the care plan section) Progress towards PT goals: Progressing toward goals ? ?  ?Frequency ? ? ? 7X/week ? ? ? ?  ?PT Plan Current plan remains appropriate  ? ? ?Co-evaluation   ?  ?  ?  ?  ? ?  ?AM-PAC PT "6 Clicks" Mobility   ?Outcome Measure ?   ?  ?  ?  ?  ?  ?  ? ?  ?End of Session Equipment Utilized During Treatment: Gait belt ?Activity Tolerance: Patient tolerated treatment well ?Patient left: in chair;with chair alarm set;with call bell/phone within reach;with family/visitor present ?Nurse Communication: Mobility status ?PT Visit Diagnosis: Unsteadiness on feet (R26.81);Muscle weakness (generalized) (M62.81);Difficulty in walking, not elsewhere classified (R26.2);Pain ?Pain - part of body:  (lumbago) ?  ? ? ?Time: 7408-1448 ?PT Time Calculation (min) (ACUTE ONLY): 18 min ? ?Charges:  $Gait Training: 8-22 mins          ?          ? ?Malachi Pro, DPT ?06/04/2021, 3:29 PM ? ?

## 2021-06-04 NOTE — Plan of Care (Signed)
?  Problem: Education: ?Goal: Knowledge of General Education information will improve ?Description: Including pain rating scale, medication(s)/side effects and non-pharmacologic comfort measures ?Outcome: Progressing ?  ?Problem: Health Behavior/Discharge Planning: ?Goal: Ability to manage health-related needs will improve ?Outcome: Progressing ?  ?Problem: Clinical Measurements: ?Goal: Ability to maintain clinical measurements within normal limits will improve ?Outcome: Progressing ?  ?Problem: Clinical Measurements: ?Goal: Will remain free from infection ?Outcome: Progressing ?  ?Problem: Activity: ?Goal: Risk for activity intolerance will decrease ?Outcome: Progressing ?  ?Problem: Nutrition: ?Goal: Adequate nutrition will be maintained ?Outcome: Progressing ?  ?Problem: Skin Integrity: ?Goal: Risk for impaired skin integrity will decrease ?Outcome: Progressing ?  ?Problem: Safety: ?Goal: Ability to remain free from injury will improve ?Outcome: Progressing ?  ?Problem: Pain Managment: ?Goal: General experience of comfort will improve ?Outcome: Progressing ?  ?

## 2021-06-05 MED ORDER — SENNA 8.6 MG PO TABS: 1.0000 | ORAL_TABLET | Freq: Two times a day (BID) | ORAL | 0 refills | Status: AC | PRN

## 2021-06-05 MED ORDER — METHOCARBAMOL 500 MG PO TABS: 500.0000 mg | ORAL_TABLET | Freq: Four times a day (QID) | ORAL | 0 refills | Status: AC | PRN

## 2021-06-05 MED ORDER — OXYCODONE HCL 5 MG PO TABS: 5.0000 mg | ORAL_TABLET | ORAL | 0 refills | Status: AC | PRN

## 2021-06-05 NOTE — Progress Notes (Signed)
Physical Therapy Treatment ?Patient Details ?Name: Clarence Wolf [facility] ?MRN: [id number] ?DOB: [birth year] ?Today's Date: 06/05/2021 ? ? 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 ? ?  ?PT Comments  ? ? Pt up and moving on arrival, preparing for d/c home later this morning.  He was able to ambulate multiple loops around nurses station (showed safe ability to use cane ~50 ft and stairs) but was more confident and comfortable with FWW and we did remainder of the effort with RW.  Overall did well, showed no increased pain with activity (though more general pain today than yesterday).     ?Recommendations for follow up therapy are one component of a multi-disciplinary discharge planning process, led by the attending physician.  Recommendations may be updated based on patient status, additional functional criteria and insurance authorization. ? ?Follow Up Recommendations ? Home health PT ?  ?  ?Assistance Recommended at Discharge Intermittent Supervision/Assistance  ?Patient can return home with the following Assistance with cooking/housework;A little help with bathing/dressing/bathroom;Assist for transportation ?  ?Equipment Recommendations ? None recommended by PT  ?  ?Recommendations for Other Services   ? ? ?  ?Precautions / Restrictions Precautions ?Precautions: Fall;Back ?Restrictions ?Weight Bearing Restrictions: No  ?  ? ?Mobility ? Bed Mobility ?  ?  ?  ?  ?  ?  ?  ?General bed mobility comments: Pt up at EOB, wife assisting, getting ready for d/c on arrival ?  ? ?Transfers ?Overall transfer level: Independent ?Equipment used: Rolling walker (2 wheels), Straight cane ?Transfers: Sit to/from Stand ?Sit to Stand: Min guard ?  ?  ?  ?  ?  ?  ?  ? ?Ambulation/Gait ?Ambulation/Gait assistance: Modified independent (Device/Increase time) ?Gait  Distance (Feet): 350 Feet ?Assistive device: Rolling walker (2 wheels) ?  ?  ?  ?  ?General Gait Details: Pt with safe gait with minimal guarding and good overal safety/confidence.  He was able to use cane to safely ambulation but again showed increased confidence and comfort with FWW and we therefore use it most of the time. ? ? ?Stairs ?Stairs: Yes ?  ?Stair Management: Two rails ?Number of Stairs: 4 ?General stair comments: Pt was able to negotiate up/down steps w/o assist and was able to use/control cane independently with getting up/down them.  Safe and essentially independent with the effort, no issues. ? ? ?Wheelchair Mobility ?  ? ?Modified Rankin (Stroke Patients Only) ?  ? ? ?  ?Balance   ?  ?Sitting balance-Leahy Scale: Good ?  ?  ?  ?Standing balance-Leahy Scale: Good ?  ?  ?  ?  ?  ?  ?  ?  ?  ?  ?  ?  ?  ? ?  ?Cognition Arousal/Alertness: Awake/alert ?Behavior During Therapy: [facility] for tasks assessed/performed ?Overall Cognitive Status: Within Functional Limits for tasks assessed ?  ?  ?  ?  ?  ?  ?  ?  ?  ?  ?  ?  ?  ?  ?  ?  ?  ?  ?  ? ?  ?Exercises   ? ?  ?General Comments General comments (skin integrity, edema, etc.): reviewed expected progression of care/activity, appropriate precuations/limitations and  answered questions ?  ?  ? ?Pertinent Vitals/Pain Pain Assessment ?Pain Assessment: 0-10 ?Pain Score: 7  ?Pain Location: back - reports more stiffness  today than yesterday  ? ? ?Home Living   ?  ?  ?  ?  ?  ?  ?  ?  ?  ?   ?  ?Prior Function    ?  ?  ?   ? ?PT Goals (current goals can now be found in the care plan section) Progress towards PT goals: Progressing toward goals ? ?  ?Frequency ? ? ? 7X/week ? ? ? ?  ?PT Plan Current plan remains appropriate  ? ? ?Co-evaluation   ?  ?  ?  ?  ? ?  ?AM-PAC PT "6 Clicks" Mobility   ?Outcome Measure ? Help needed turning from your back to your side while in a flat bed without using bedrails?: None ?Help needed moving from lying on your back to sitting on  the side of a flat bed without using bedrails?: None ?Help needed moving to and from a bed to a chair (including a wheelchair)?: None ?Help needed standing up from a chair using your arms (e.g., wheelchair or bedside chair)?: None ?Help needed to walk in hospital room?: None ?Help needed climbing 3-5 steps with a railing? : A Little ?6 Click Score: 23 ? ?  ?End of Session Equipment Utilized During Treatment: Gait belt ?Activity Tolerance: Patient tolerated treatment well ?Patient left: in chair;with chair alarm set;with call bell/phone within reach;with family/visitor present ?Nurse Communication: Mobility status ?PT Visit Diagnosis: Unsteadiness on feet (R26.81);Muscle weakness (generalized) (M62.81);Difficulty in walking, not elsewhere classified (R26.2);Pain ?Pain - part of body:  (lumbago) ?  ? ? ?Time: 0981-1914 ?PT Time Calculation (min) (ACUTE ONLY): 12 min ? ?Charges:  $Gait Training: 8-22 mins          ?          ? ?Malachi Pro, DPT ?06/05/2021, 10:27 AM ? ?

## 2021-06-05 NOTE — Progress Notes (Signed)
? ?   Attending Progress Note ? ?History: Clarence Wolf is s/p L3-S1 decompression. Left L2-3 microdiscectomy. ? ?POD2: Feeling better this morning with expected back pain  ? ?POD1: Complains of some incisional pain today. Denies any leg pain. States he ambulated with PT yesterday morning  ? ?Physical Exam: ?Vitals:  ? 06/05/21 0042 06/05/21 0456  ?BP: 112/69 118/83  ?Pulse: (!) 55 (!) 53  ?Resp: 18 18  ?Temp: 97.9 ?F (36.6 ?C) 98 ?F (36.7 ?C)  ?SpO2: 97% 99%  ? ? ?AA Ox3 ambulating independently to the sink ?CNI ? ?Strength:5/5 throughout BLE ?HV output 60 yesterday ? ?Data: ? ?No results for input(s): NA, K, CL, CO2, BUN, CREATININE, LABGLOM, GLUCOSE, CALCIUM in the last 168 hours. ?No results for input(s): AST, ALT, ALKPHOS in the last 168 hours. ? ?Invalid input(s): TBILI  ? No results for input(s): WBC, HGB, HCT, PLT in the last 168 hours. ?No results for input(s): APTT, INR in the last 168 hours.  ?   ? ? ?Other tests/results: none  ? ?Assessment/Plan: ? ?KHALIFAH MACDOUGALL is a 71 y.o s/p lumbar decompression on [id number] without complications for neurogenic claudication. ? ?- mobilize ?- pain control ?- DVT prophylaxis ?- PTOT ?- removed HV this morning.  ?- dispo planning underway ? ?Cooper Render PA-C ?Department of Neurosurgery ? ?  ?

## 2021-06-05 NOTE — Progress Notes (Signed)
?   06/05/21 0800  ?Clinical Encounter Type  ?Visited With Patient and family together  ?Visit Type Follow-up;Post-op  ?Spiritual Encounters  ?Spiritual Needs Prayer  ? ?Chaplain followed up on patient and family post op ?

## 2021-06-18 ENCOUNTER — Ambulatory Visit: Payer: Medicare Other | Admitting: Student in an Organized Health Care Education/Training Program

## 2022-01-06 ENCOUNTER — Encounter (INDEPENDENT_AMBULATORY_CARE_PROVIDER_SITE_OTHER): Payer: Self-pay
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
## 2023-07-26 IMAGING — RF DG LUMBAR SPINE 2-3V
1 series · 4 of 4 positions shown · non-contrast
Comparison: April 03, 2021.

CLINICAL DATA: L3 through S1 decompression.

EXAM:
LUMBAR SPINE - 2-3 VIEW; DG C-ARM 1-60 MIN-NO REPORT
Radiation exposure index: 3.9391 mGy.

[Series 1: dg x-ray · 0.20mm/px · 4 of 4 slices shown]
[im 1/4]
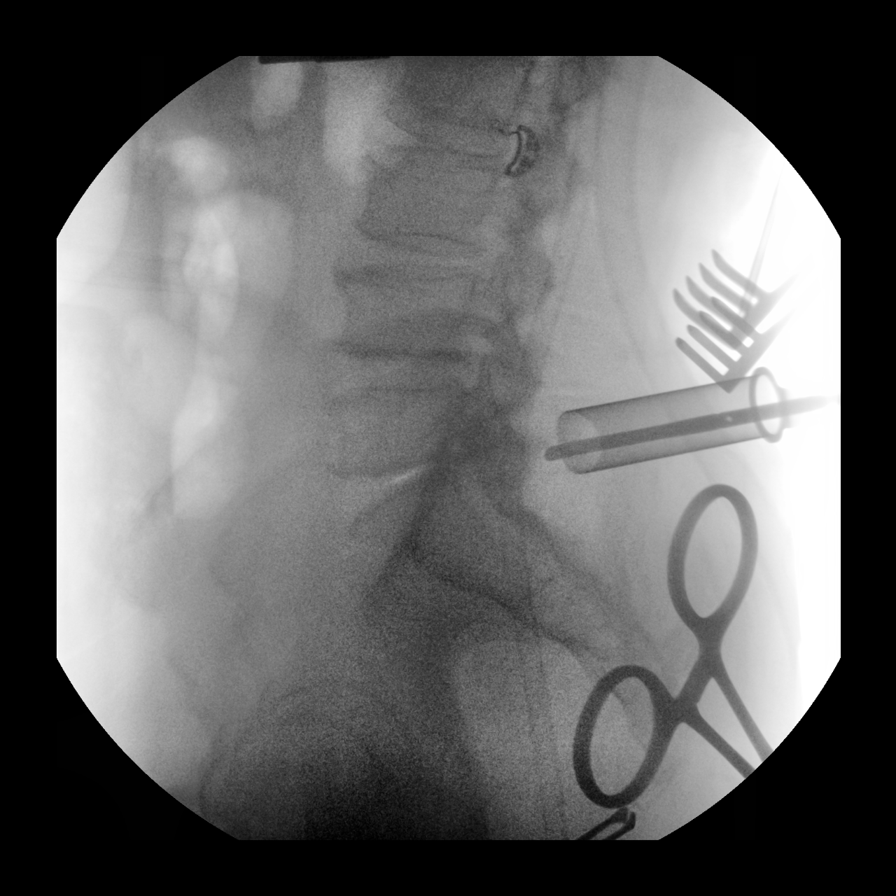
[im 2/4]
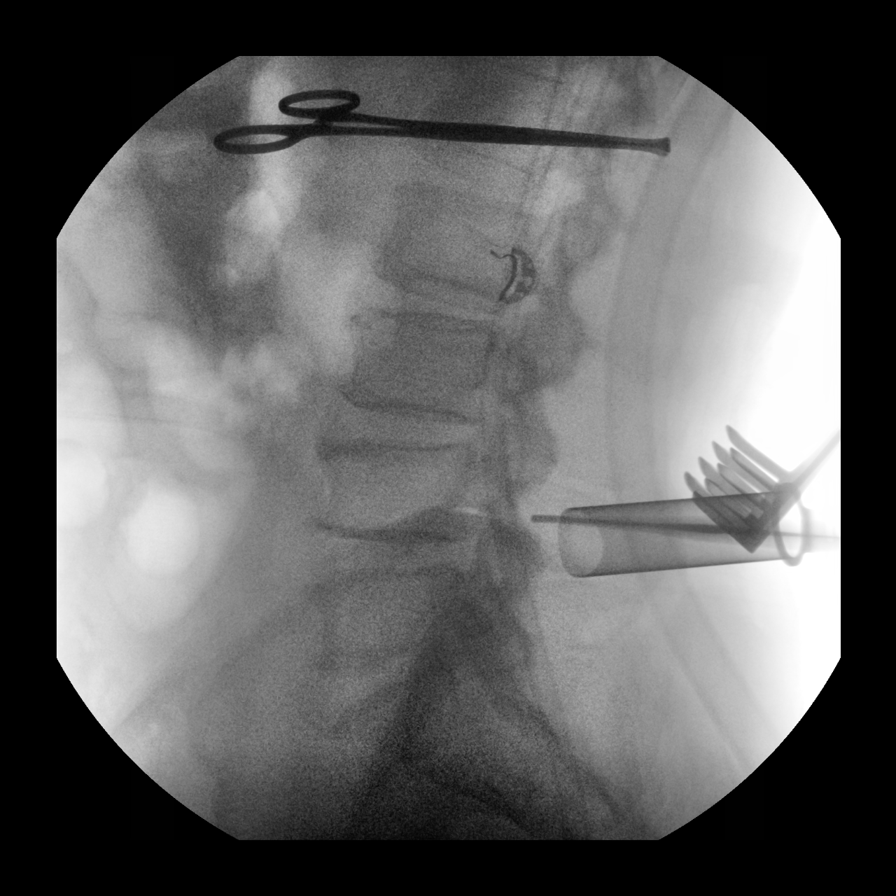
[im 3/4]
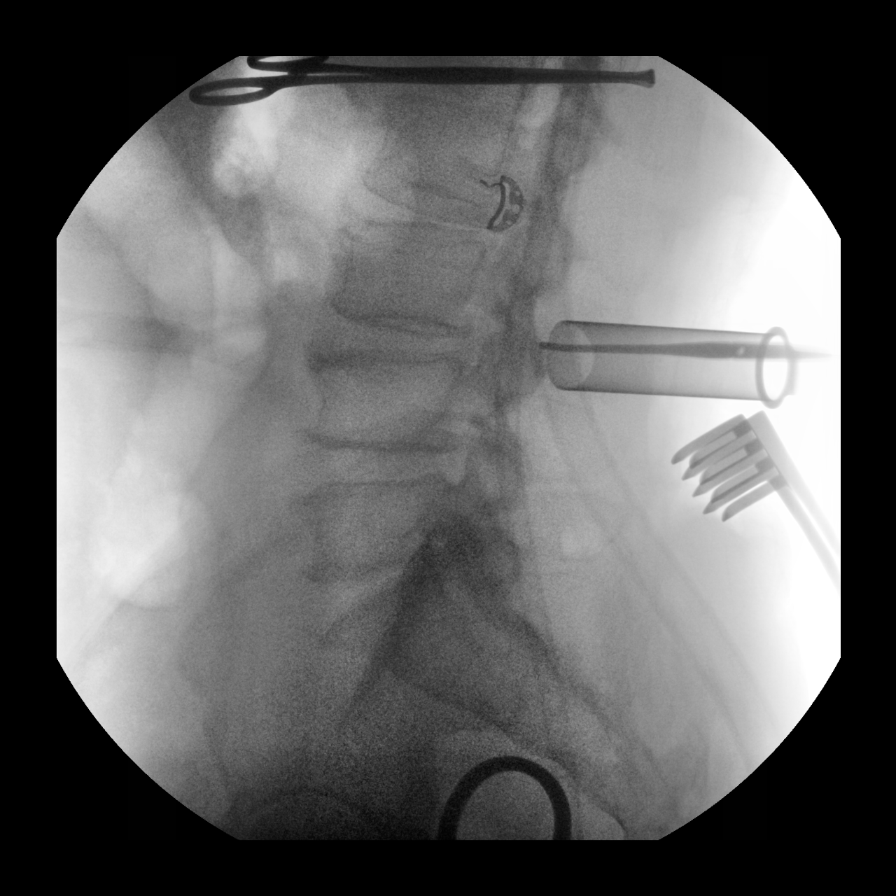
[im 4/4]
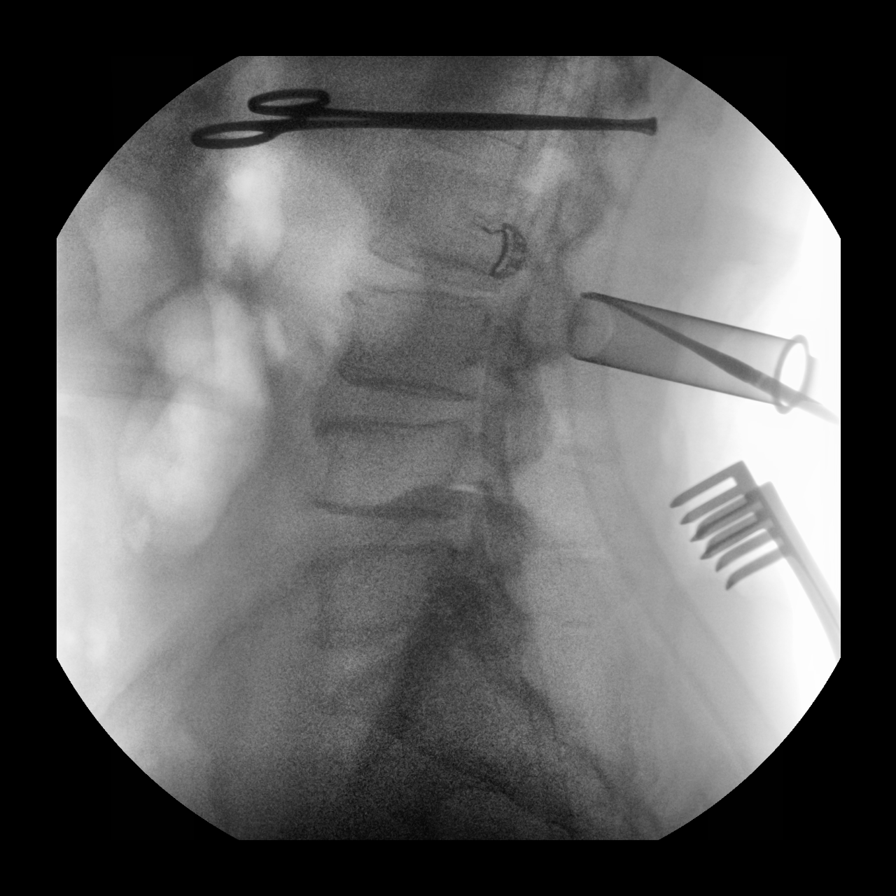

[4 of 4 positions shown; findings below may reference images not displayed]

FINDINGS: Four intraoperative fluoroscopic images were obtained of the lumbar
spine. These images demonstrate surgical localization of the L2-3,
L3-4, L4-5 and L5-S1 levels.
IMPRESSION: Fluoroscopic guidance provided during lumbar surgery.
